# Patient Record
Sex: Female | Born: 1970 | Race: Black or African American | Hispanic: No | Marital: Single | Smoking: Never smoker
Health system: Southern US, Community
[De-identification: ages and names within clinical notes are randomized; demographics above are authoritative.]

## PROBLEM LIST (undated history)

## (undated) DIAGNOSIS — J302 Other seasonal allergic rhinitis: Secondary | ICD-10-CM

## (undated) DIAGNOSIS — I471 Supraventricular tachycardia, unspecified: Secondary | ICD-10-CM

## (undated) DIAGNOSIS — I10 Essential (primary) hypertension: Secondary | ICD-10-CM

## (undated) DIAGNOSIS — K760 Fatty (change of) liver, not elsewhere classified: Secondary | ICD-10-CM

## (undated) HISTORY — PX: CARDIAC ELECTROPHYSIOLOGY MAPPING AND ABLATION: SHX1292

## (undated) HISTORY — PX: ABDOMINAL HYSTERECTOMY: SHX81

## (undated) HISTORY — PX: OTHER SURGICAL HISTORY: SHX169

---

## 2012-09-06 ENCOUNTER — Other Ambulatory Visit: Payer: Self-pay | Admitting: Gastroenterology

## 2012-09-06 DIAGNOSIS — R1013 Epigastric pain: Secondary | ICD-10-CM

## 2012-09-06 DIAGNOSIS — R112 Nausea with vomiting, unspecified: Secondary | ICD-10-CM

## 2012-09-21 ENCOUNTER — Encounter (HOSPITAL_COMMUNITY)
Admission: RE | Admit: 2012-09-21 | Discharge: 2012-09-21 | Disposition: A | Discharge status: Home / Self Care | Source: Ambulatory Visit | Attending: Gastroenterology | Admitting: Gastroenterology

## 2012-09-21 ENCOUNTER — Ambulatory Visit (HOSPITAL_COMMUNITY)
Admission: RE | Admit: 2012-09-21 | Discharge: 2012-09-21 | Disposition: A | Discharge status: Home / Self Care | Source: Ambulatory Visit | Attending: Gastroenterology | Admitting: Gastroenterology

## 2012-09-21 DIAGNOSIS — R1013 Epigastric pain: Secondary | ICD-10-CM | POA: Insufficient documentation

## 2012-09-21 DIAGNOSIS — R112 Nausea with vomiting, unspecified: Secondary | ICD-10-CM | POA: Insufficient documentation

## 2012-09-21 DIAGNOSIS — K7689 Other specified diseases of liver: Secondary | ICD-10-CM | POA: Insufficient documentation

## 2012-09-23 ENCOUNTER — Encounter (HOSPITAL_COMMUNITY)
Admission: RE | Admit: 2012-09-23 | Discharge: 2012-09-23 | Disposition: A | Discharge status: Home / Self Care | Source: Ambulatory Visit | Attending: Gastroenterology | Admitting: Gastroenterology

## 2012-09-23 DIAGNOSIS — R1013 Epigastric pain: Secondary | ICD-10-CM | POA: Insufficient documentation

## 2012-09-23 DIAGNOSIS — R1110 Vomiting, unspecified: Secondary | ICD-10-CM | POA: Insufficient documentation

## 2012-09-23 MED ORDER — TECHNETIUM TC 99M MEBROFENIN IV KIT
5.0000 | PACK | Freq: Once | INTRAVENOUS | Status: AC | PRN
  Administered 2012-09-23: 5 via INTRAVENOUS

## 2012-09-23 MED ORDER — SINCALIDE 5 MCG IJ SOLR
INTRAMUSCULAR | Status: AC
  Administered 2012-09-23: 3.3 ug
  Filled 2012-09-23: qty 5

## 2012-10-10 ENCOUNTER — Other Ambulatory Visit: Payer: Self-pay | Admitting: Gastroenterology

## 2012-10-10 DIAGNOSIS — K760 Fatty (change of) liver, not elsewhere classified: Secondary | ICD-10-CM

## 2012-10-17 ENCOUNTER — Encounter (HOSPITAL_COMMUNITY): Payer: Self-pay

## 2012-10-17 ENCOUNTER — Other Ambulatory Visit: Payer: Self-pay | Admitting: Physician Assistant

## 2012-10-20 ENCOUNTER — Other Ambulatory Visit (HOSPITAL_COMMUNITY): Payer: Self-pay | Admitting: Physician Assistant

## 2012-10-20 ENCOUNTER — Ambulatory Visit (HOSPITAL_COMMUNITY)
Admission: RE | Admit: 2012-10-20 | Discharge: 2012-10-20 | Disposition: A | Discharge status: Home / Self Care | Source: Ambulatory Visit | Attending: Physician Assistant | Admitting: Physician Assistant

## 2012-10-20 ENCOUNTER — Encounter (HOSPITAL_COMMUNITY): Payer: Self-pay

## 2012-10-20 ENCOUNTER — Ambulatory Visit (HOSPITAL_COMMUNITY)
Admission: RE | Admit: 2012-10-20 | Discharge: 2012-10-20 | Disposition: A | Discharge status: Home / Self Care | Source: Ambulatory Visit | Attending: Gastroenterology | Admitting: Gastroenterology

## 2012-10-20 DIAGNOSIS — G8918 Other acute postprocedural pain: Secondary | ICD-10-CM

## 2012-10-20 DIAGNOSIS — K7689 Other specified diseases of liver: Secondary | ICD-10-CM | POA: Insufficient documentation

## 2012-10-20 DIAGNOSIS — K760 Fatty (change of) liver, not elsewhere classified: Secondary | ICD-10-CM

## 2012-10-20 HISTORY — DX: Fatty (change of) liver, not elsewhere classified: K76.0

## 2012-10-20 HISTORY — DX: Essential (primary) hypertension: I10

## 2012-10-20 LAB — CBC
Hemoglobin: 11.9 g/dL — ABNORMAL LOW (ref 12.0–15.0)
MCHC: 35.2 g/dL (ref 30.0–36.0)
Platelets: 179 10*3/uL (ref 150–400)
RDW: 11.9 % (ref 11.5–15.5)

## 2012-10-20 LAB — PROTIME-INR
INR: 0.99 (ref 0.00–1.49)
Prothrombin Time: 13 seconds (ref 11.6–15.2)

## 2012-10-20 LAB — APTT: aPTT: 29 seconds (ref 24–37)

## 2012-10-20 MED ORDER — MIDAZOLAM HCL 2 MG/2ML IJ SOLN
INTRAMUSCULAR | Status: AC
  Filled 2012-10-20: qty 4

## 2012-10-20 MED ORDER — FENTANYL CITRATE 0.05 MG/ML IJ SOLN
INTRAMUSCULAR | Status: DC | PRN
  Administered 2012-10-20 (×2): 25 ug via INTRAVENOUS
  Administered 2012-10-20: 50 ug via INTRAVENOUS

## 2012-10-20 MED ORDER — HYDROMORPHONE HCL PF 1 MG/ML IJ SOLN
INTRAMUSCULAR | Status: AC
  Administered 2012-10-20: 12:00:00 via INTRAVENOUS
  Filled 2012-10-20: qty 1

## 2012-10-20 MED ORDER — HYDROCODONE-ACETAMINOPHEN 5-325 MG PO TABS: 1.0000 | ORAL_TABLET | ORAL | Status: DC | PRN

## 2012-10-20 MED ORDER — HYDROMORPHONE HCL PF 1 MG/ML IJ SOLN: 1.0000 mg | Freq: Once | INTRAMUSCULAR | Status: DC

## 2012-10-20 MED ORDER — HYDROCODONE-ACETAMINOPHEN 5-325 MG PO TABS
ORAL_TABLET | ORAL | Status: AC
  Filled 2012-10-20: qty 1

## 2012-10-20 MED ORDER — HYDROCODONE-ACETAMINOPHEN 5-325 MG PO TABS
1.0000 | ORAL_TABLET | Freq: Once | ORAL | Status: AC
  Administered 2012-10-20: 1 via ORAL

## 2012-10-20 MED ORDER — ONDANSETRON HCL 4 MG/2ML IJ SOLN: 4.0000 mg | Freq: Once | INTRAMUSCULAR | Status: DC

## 2012-10-20 MED ORDER — ONDANSETRON HCL 4 MG/2ML IJ SOLN
4.0000 mg | Freq: Once | INTRAMUSCULAR | Status: AC
  Administered 2012-10-20: 4 mg via INTRAVENOUS

## 2012-10-20 MED ORDER — SODIUM CHLORIDE 0.9 % IV SOLN
INTRAVENOUS | Status: DC
  Administered 2012-10-20: 09:00:00 via INTRAVENOUS

## 2012-10-20 MED ORDER — FENTANYL CITRATE 0.05 MG/ML IJ SOLN
INTRAMUSCULAR | Status: AC
  Filled 2012-10-20: qty 4

## 2012-10-20 MED ORDER — ONDANSETRON HCL 4 MG/2ML IJ SOLN
INTRAMUSCULAR | Status: AC
  Filled 2012-10-20: qty 2

## 2012-10-20 MED ORDER — MIDAZOLAM HCL 2 MG/2ML IJ SOLN
INTRAMUSCULAR | Status: DC | PRN
  Administered 2012-10-20 (×2): 1 mg via INTRAVENOUS

## 2012-10-20 NOTE — Progress Notes (Signed)
CLIENT GRIMACES WHEN MOVES IN BED; STATES DOES NOT WANT PAIN MED; PAMELA CAMPBELL,PA NOTIFIED AND IN TO SEE CLIENT

## 2012-10-20 NOTE — Progress Notes (Signed)
STATES I FEEL 100% BETTER NO NAUSEA NO PAIN

## 2012-10-20 NOTE — Progress Notes (Signed)
PAMELA CAMPBELL,PA NOTIFIED OF CONT NAUSEA AND VOMITING NOW YELLOW LIQUID AND ORDER NOTED AND MED GIVEN AND PER PAMELA CAMPBELL,PA WANTS CLIENT TO TAKE PAIN PILL AFTER NAUSEA SUBSIDES

## 2012-10-20 NOTE — Progress Notes (Signed)
PAMELA CAMPBELL NOTIFIED OF CONT C/O PAIN AND ORDER NOTED

## 2012-10-20 NOTE — H&P (Signed)
Ashley George is an 41 y.o. female.   Chief Complaint: Diagnosed with fatty liver -  Non alcoholic 3 yrs ago Most recent liver biopsy 2 yrs ago:  [facility] Liver fxn studies continue to be elevated/stable Scheduled now for liver core biopsy HPI: HTN; elevated LFTs  Past Medical History  Diagnosis Date  . Hypertension   . Fatty liver disease, nonalcoholic     History reviewed. No pertinent past surgical history.  History reviewed. No pertinent family history. Social History:  does not have a smoking history on file. She does not have any smokeless tobacco history on file. Her alcohol and drug histories not on file.  Allergies: No Known Allergies   (Not in a hospital admission)  Results for orders placed during the hospital encounter of 10/20/12 (from the past 48 hour(s))  CBC     Status: Abnormal   Collection Time   10/20/12  8:31 AM      Component Value Range Comment   WBC 6.0  4.0 - 10.5 K/uL    RBC 4.11  3.87 - 5.11 MIL/uL    Hemoglobin 11.9 (*) 12.0 - 15.0 g/dL    HCT 16.1 (*) 09.6 - 46.0 %    MCV 82.2  78.0 - 100.0 fL    MCH 29.0  26.0 - 34.0 pg    MCHC 35.2  30.0 - 36.0 g/dL    RDW 04.5  40.9 - 81.1 %    Platelets 179  150 - 400 K/uL    No results found.  Review of Systems  Constitutional: Negative for fever and weight loss.  Respiratory: Negative for cough.   Cardiovascular: Negative for chest pain.  Gastrointestinal: Negative for nausea and vomiting.  Neurological: Negative for weakness and headaches.    Blood pressure 233/87, pulse 73, temperature 97.9 F (36.6 C), temperature source Oral, resp. rate 18, height 5\' 2"  (1.575 m), weight 144 lb (65.318 kg), SpO2 97.00%. Physical Exam  Constitutional: She is oriented to person, place, and time. She appears well-developed and well-nourished.  Cardiovascular: Normal rate, regular rhythm and normal heart sounds.   No murmur heard. Respiratory: Effort normal and breath sounds normal. She has no wheezes.  GI: Soft.  Bowel sounds are normal. There is no tenderness.  Musculoskeletal: Normal range of motion.  Neurological: She is alert and oriented to person, place, and time.  Psychiatric: She has a normal mood and affect. Her behavior is normal. Judgment and thought content normal.     Assessment/Plan Elevated liver fxn studies x 3 yrs Scheduled for liver core biopsy: evaluate hepatitis status Pt aware of procedure benefits and risks and agreeable to proceed Consent signed and in chart  Reagen Goates A 10/20/2012, 9:23 AM

## 2012-10-20 NOTE — ED Notes (Signed)
C/o pain at incisional site, assist pt to R side, P. [facility] orders rec'd

## 2012-10-20 NOTE — Procedures (Addendum)
Procedure :" random liver core needle biopsy Specimen : 18 g cores x 3 meds : 2  mg versed, 100 mcg fentanyl Blood loss : minimal  Pt tolerated well

## 2012-10-20 NOTE — ED Notes (Signed)
Short stay notified for bed 

## 2012-10-20 NOTE — Progress Notes (Signed)
STATES NAUSEA BETTER AND ATE SANDWICH

## 2012-10-20 NOTE — Progress Notes (Signed)
C/O NAUSEA; NO VOMITING; PAM TURPIN NOTIFIED AND ORDER NOTED

## 2012-10-20 NOTE — H&P (Signed)
Agree 

## 2012-11-03 ENCOUNTER — Encounter (HOSPITAL_COMMUNITY): Payer: Self-pay | Admitting: *Deleted

## 2012-11-03 ENCOUNTER — Emergency Department (INDEPENDENT_AMBULATORY_CARE_PROVIDER_SITE_OTHER)
Admission: EM | Admit: 2012-11-03 | Discharge: 2012-11-03 | Disposition: A | Discharge status: Home / Self Care | Source: Home / Self Care | Attending: Family Medicine | Admitting: Family Medicine

## 2012-11-03 DIAGNOSIS — R1110 Vomiting, unspecified: Secondary | ICD-10-CM

## 2012-11-03 DIAGNOSIS — E876 Hypokalemia: Secondary | ICD-10-CM

## 2012-11-03 LAB — POCT I-STAT, CHEM 8
BUN: 11 mg/dL (ref 6–23)
Calcium, Ion: 1.21 mmol/L (ref 1.12–1.23)
Chloride: 102 mEq/L (ref 96–112)
Creatinine, Ser: 0.6 mg/dL (ref 0.50–1.10)
Glucose, Bld: 141 mg/dL — ABNORMAL HIGH (ref 70–99)
TCO2: 28 mmol/L (ref 0–100)

## 2012-11-03 MED ORDER — ONDANSETRON HCL 4 MG PO TABS: 4.0000 mg | ORAL_TABLET | Freq: Four times a day (QID) | ORAL | Status: DC

## 2012-11-03 MED ORDER — POTASSIUM CHLORIDE ER 10 MEQ PO TBCR: 10.0000 meq | EXTENDED_RELEASE_TABLET | Freq: Two times a day (BID) | ORAL | Status: DC

## 2012-11-03 NOTE — ED Provider Notes (Signed)
History     CSN: [id number]  Arrival date & time 11/03/12  1549   First MD Initiated Contact with Patient 11/03/12 1629      Chief Complaint  Patient presents with  . Dizziness    (Consider location/radiation/quality/duration/timing/severity/associated sxs/prior treatment) Patient is a 41 y.o. female presenting with vomiting. The history is provided by the patient.  Emesis  This is a new problem. The current episode started more than 2 days ago (had liver bx by Dr Loreta Ave on 12/5, current sx started on mon 12/16.). The problem has not changed since onset.The emesis has an appearance of stomach contents and bilious material. There has been no fever. Associated symptoms include abdominal pain and headaches. Pertinent negatives include no chills, no diarrhea and no fever.    Past Medical History  Diagnosis Date  . Hypertension   . Fatty liver disease, nonalcoholic     Past Surgical History  Procedure Date  . Hepatic biopsy     x 2  . Abdominal hysterectomy   . Cardiac electrophysiology mapping and ablation     Family History  Problem Relation Age of Onset  . Heart disease Mother   . Diabetes Mother   . Schizophrenia Mother   . Diabetes Father   . Thyroid disease Father     History  Substance Use Topics  . Smoking status: Never Smoker   . Smokeless tobacco: Not on file  . Alcohol Use: No    OB History    Grav Para Term Preterm Abortions TAB SAB Ect Mult Living                  Review of Systems  Constitutional: Negative for fever and chills.  Cardiovascular: Negative for palpitations.  Gastrointestinal: Positive for nausea, vomiting and abdominal pain. Negative for diarrhea, constipation and blood in stool.  Neurological: Positive for headaches.    Allergies  Review of patient's allergies indicates no known allergies.  Home Medications   Current Outpatient Rx  Name  Route  Sig  Dispense  Refill  . ESOMEPRAZOLE MAGNESIUM 40 MG PO CPDR   Oral   Take 40  mg by mouth 2 (two) times daily.         Marland Kitchen LISINOPRIL 10 MG PO TABS   Oral   Take 10 mg by mouth daily.         Marland Kitchen ONDANSETRON HCL 4 MG PO TABS   Oral   Take 1 tablet (4 mg total) by mouth every 6 (six) hours. As needed for n/v   12 tablet   0   . POTASSIUM CHLORIDE ER 10 MEQ PO TBCR   Oral   Take 1 tablet (10 mEq total) by mouth 2 (two) times daily.   60 tablet   0     BP 129/84  Pulse 76  Temp 97.8 F (36.6 C) (Oral)  Resp 17  SpO2 100%  Physical Exam  Nursing note and vitals reviewed. Constitutional: She is oriented to person, place, and time. She appears well-developed and well-nourished.  Neck: Normal range of motion. Neck supple.  Cardiovascular: Normal rate, normal heart sounds and intact distal pulses.   Pulmonary/Chest: Breath sounds normal.  Abdominal: Soft. Bowel sounds are normal. She exhibits mass. She exhibits no distension. There is hepatomegaly. There is no splenomegaly. There is tenderness in the epigastric area. There is no rebound, no guarding and no CVA tenderness.  Neurological: She is alert and oriented to person, place, and time.  Skin: Skin  is warm and dry.    ED Course  Procedures (including critical care time)  Labs Reviewed  POCT I-STAT, CHEM 8 - Abnormal; Notable for the following:    Potassium 2.9 (*)     Glucose, Bld 141 (*)     All other components within normal limits   No results found.   1. Chronic hypokalemia   2. Vomiting alone       MDM  i-stat--k 2.9, glu 141. Pt with known hypokalemia, does not want to go to ER, will see lmd if problems continue.        Linna Hoff, MD 11/04/12 [phone]

## 2012-11-03 NOTE — ED Notes (Signed)
C/o being lightheaded, vomiting and sweating onset Mon. Vomited 3-4 on Mon., 3 x yesterday and once today. No diarrhea or abdominal pain.

## 2012-11-17 ENCOUNTER — Emergency Department (INDEPENDENT_AMBULATORY_CARE_PROVIDER_SITE_OTHER)
Admission: EM | Admit: 2012-11-17 | Discharge: 2012-11-17 | Disposition: A | Discharge status: Home / Self Care | Source: Home / Self Care

## 2012-11-17 ENCOUNTER — Encounter (HOSPITAL_COMMUNITY): Payer: Self-pay

## 2012-11-17 DIAGNOSIS — J069 Acute upper respiratory infection, unspecified: Secondary | ICD-10-CM

## 2012-11-17 DIAGNOSIS — J45909 Unspecified asthma, uncomplicated: Secondary | ICD-10-CM

## 2012-11-17 LAB — POCT RAPID STREP A: Streptococcus, Group A Screen (Direct): NEGATIVE

## 2012-11-17 MED ORDER — ALBUTEROL SULFATE HFA 108 (90 BASE) MCG/ACT IN AERS: 2.0000 | INHALATION_SPRAY | RESPIRATORY_TRACT | Status: DC | PRN

## 2012-11-17 NOTE — ED Provider Notes (Signed)
Medical screening examination/treatment/procedure(s) were performed by non-physician practitioner and as supervising physician I was immediately available for consultation/collaboration.  Leslee Home, M.D.   Reuben Likes, MD 11/17/12 [phone]

## 2012-11-17 NOTE — ED Provider Notes (Signed)
History     CSN: [id number]  Arrival date & time 11/17/12  1008   First MD Initiated Contact with Patient 11/17/12 1058      Chief Complaint  Patient presents with  . Sore Throat    (Consider location/radiation/quality/duration/timing/severity/associated sxs/prior treatment) HPI Comments: 42 year old female presents with cough and sore throat. She states she was seen here the urgent care approximately third week in December of 2013. And diagnosed with bronchitis. She also had a hypokalemia which was treated with potassium. She left directly to go to [place] where she was seen again and had a potassium evaluated and was told her level is normal. Completed a course of amoxicillin for her bronchitis but she is now having persistent cough and sore throat. She is unsure about fever but no elevated temperature today.   Past Medical History  Diagnosis Date  . Hypertension   . Fatty liver disease, nonalcoholic     Past Surgical History  Procedure Date  . Hepatic biopsy     x 2  . Abdominal hysterectomy   . Cardiac electrophysiology mapping and ablation     Family History  Problem Relation Age of Onset  . Heart disease Mother   . Diabetes Mother   . Schizophrenia Mother   . Diabetes Father   . Thyroid disease Father     History  Substance Use Topics  . Smoking status: Never Smoker   . Smokeless tobacco: Not on file  . Alcohol Use: No    OB History    Grav Para Term Preterm Abortions TAB SAB Ect Mult Living                  Review of Systems  Constitutional: Negative for fever, chills, activity change, appetite change and fatigue.  HENT: Positive for sore throat, rhinorrhea and postnasal drip. Negative for facial swelling, neck pain and neck stiffness.   Eyes: Negative.   Respiratory: Positive for cough. Negative for wheezing.   Cardiovascular: Negative.   Gastrointestinal: Negative.   Musculoskeletal: Negative.   Skin: Negative for pallor and rash.    Neurological: Negative.     Allergies  Review of patient's allergies indicates no known allergies.  Home Medications   Current Outpatient Rx  Name  Route  Sig  Dispense  Refill  . ESOMEPRAZOLE MAGNESIUM 40 MG PO CPDR   Oral   Take 40 mg by mouth 2 (two) times daily.         Marland Kitchen LISINOPRIL 10 MG PO TABS   Oral   Take 10 mg by mouth daily.         . ALBUTEROL SULFATE HFA 108 (90 BASE) MCG/ACT IN AERS   Inhalation   Inhale 2 puffs into the lungs every 4 (four) hours as needed for wheezing.   1 Inhaler   0   . ONDANSETRON HCL 4 MG PO TABS   Oral   Take 1 tablet (4 mg total) by mouth every 6 (six) hours. As needed for n/v   12 tablet   0   . POTASSIUM CHLORIDE ER 10 MEQ PO TBCR   Oral   Take 1 tablet (10 mEq total) by mouth 2 (two) times daily.   60 tablet   0     BP 122/86  Pulse 77  Temp 98.1 F (36.7 C) (Oral)  Resp 18  SpO2 96%  Physical Exam  Nursing note and vitals reviewed. Constitutional: She is oriented to person, place, and time. She appears well-developed  and well-nourished. No distress.  HENT:       Bilateral TMs normal Oropharynx with minor erythema, no exudate  Eyes: Conjunctivae normal and EOM are normal.  Neck: Normal range of motion. Neck supple.  Cardiovascular: Normal rate, regular rhythm and normal heart sounds.   Pulmonary/Chest: Effort normal and breath sounds normal. No respiratory distress. She has no rales.       Suppository phase prolonged with slightly diminished breath sounds.  Musculoskeletal: Normal range of motion. She exhibits no edema.  Lymphadenopathy:    She has no cervical adenopathy.  Neurological: She is alert and oriented to person, place, and time.  Skin: Skin is warm and dry. No rash noted.  Psychiatric: She has a normal mood and affect.    ED Course  Procedures (including critical care time)   Labs Reviewed  POCT RAPID STREP A ([facility] ONLY)   No results found.   1. URI (upper respiratory infection)    2. RAD (reactive airway disease)       MDM  She has a URI and viral pharyngitis. I suspect her cough is due to an occult bronchospasm. Albuterol HFA 2 puffs q. 4 hours when necessary cough and wheeze Cepacol lozenges as needed for sore throat Ibuprofen 600 mg every 6-8 hours when necessary sore throat pain For any worsening, new symptoms, problems or development of fever with cough and shortness of breath will need to seek medical attention promptly. Otherwise followup with your PCP. Results for orders placed during the hospital encounter of 11/17/12  POCT RAPID STREP A ([facility] ONLY)      Component Value Range   Streptococcus, Group A Screen (Direct) NEGATIVE  NEGATIVE          Hayden Rasmussen, NP 11/17/12 1140

## 2012-11-17 NOTE — ED Notes (Signed)
ST since yesterday

## 2013-01-09 ENCOUNTER — Emergency Department (HOSPITAL_COMMUNITY)
Admission: EM | Admit: 2013-01-09 | Discharge: 2013-01-09 | Disposition: A | Discharge status: Home / Self Care | Attending: Emergency Medicine | Admitting: Emergency Medicine

## 2013-01-09 ENCOUNTER — Encounter (HOSPITAL_COMMUNITY): Payer: Self-pay | Admitting: Emergency Medicine

## 2013-01-09 DIAGNOSIS — Z79899 Other long term (current) drug therapy: Secondary | ICD-10-CM | POA: Insufficient documentation

## 2013-01-09 DIAGNOSIS — R5381 Other malaise: Secondary | ICD-10-CM | POA: Insufficient documentation

## 2013-01-09 DIAGNOSIS — R079 Chest pain, unspecified: Secondary | ICD-10-CM | POA: Insufficient documentation

## 2013-01-09 DIAGNOSIS — Z8719 Personal history of other diseases of the digestive system: Secondary | ICD-10-CM | POA: Insufficient documentation

## 2013-01-09 DIAGNOSIS — Z9889 Other specified postprocedural states: Secondary | ICD-10-CM | POA: Insufficient documentation

## 2013-01-09 DIAGNOSIS — Z8679 Personal history of other diseases of the circulatory system: Secondary | ICD-10-CM | POA: Insufficient documentation

## 2013-01-09 DIAGNOSIS — I10 Essential (primary) hypertension: Secondary | ICD-10-CM | POA: Insufficient documentation

## 2013-01-09 HISTORY — DX: Supraventricular tachycardia: I47.1

## 2013-01-09 HISTORY — DX: Supraventricular tachycardia, unspecified: I47.10

## 2013-01-09 LAB — BASIC METABOLIC PANEL
BUN: 13 mg/dL (ref 6–23)
CO2: 29 mEq/L (ref 19–32)
Calcium: 10.1 mg/dL (ref 8.4–10.5)
Chloride: 101 mEq/L (ref 96–112)
Creatinine, Ser: 0.44 mg/dL — ABNORMAL LOW (ref 0.50–1.10)
Glucose, Bld: 126 mg/dL — ABNORMAL HIGH (ref 70–99)

## 2013-01-09 LAB — CBC
HCT: 37.9 % (ref 36.0–46.0)
MCH: 29.9 pg (ref 26.0–34.0)
MCHC: 36.4 g/dL — ABNORMAL HIGH (ref 30.0–36.0)
MCV: 82.2 fL (ref 78.0–100.0)
Platelets: 188 10*3/uL (ref 150–400)
RDW: 12.1 % (ref 11.5–15.5)

## 2013-01-09 NOTE — ED Notes (Signed)
Pt reports for the last 2 days has had dull sensation in left chest, radiating to left arm. States pain typically lasts and has been recurring more frequently up to three times a day. States pain has been made her increasingly more weak. Becomes fatigued walking upstairs.

## 2013-01-09 NOTE — ED Provider Notes (Signed)
History     CSN: [id number]  Arrival date & time 01/09/13  6213   First MD Initiated Contact with Patient 01/09/13 1049      Chief Complaint  Patient presents with  . Chest Pain    (Consider location/radiation/quality/duration/timing/severity/associated sxs/prior treatment) HPI Comments: Patient presents with a one month history of weakness and fatigue.  For the past two days she reports feeling short of breath with walking up steps and exertion.  She denies fevers or chills.  No n/v/d.  She reports to me that she had an ablation performed in 04/2012 at an outside facility in [place].  She was doing better until last month.    Patient is a 42 y.o. female presenting with chest pain. The history is provided by the patient.  Chest Pain Pain location:  L chest Pain quality: pressure   Pain radiates to:  L arm Pain radiates to the back: no   Pain severity:  Moderate Onset quality:  Gradual Timing:  Intermittent Progression:  Worsening Chronicity:  New Relieved by:  Nothing Worsened by:  Nothing tried Ineffective treatments:  None tried   Past Medical History  Diagnosis Date  . Hypertension   . Fatty liver disease, nonalcoholic   . SVT (supraventricular tachycardia)     Past Surgical History  Procedure Laterality Date  . Hepatic biopsy      x 2  . Abdominal hysterectomy    . Cardiac electrophysiology mapping and ablation      Family History  Problem Relation Age of Onset  . Heart disease Mother   . Diabetes Mother   . Schizophrenia Mother   . Diabetes Father   . Thyroid disease Father     History  Substance Use Topics  . Smoking status: Never Smoker   . Smokeless tobacco: Not on file  . Alcohol Use: No    OB History   Grav Para Term Preterm Abortions TAB SAB Ect Mult Living                  Review of Systems  Cardiovascular: Positive for chest pain.  All other systems reviewed and are negative.    Allergies  Latex  Home Medications   Current  Outpatient Rx  Name  Route  Sig  Dispense  Refill  . albuterol (PROVENTIL HFA;VENTOLIN HFA) 108 (90 BASE) MCG/ACT inhaler   Inhalation   Inhale 2 puffs into the lungs every 4 (four) hours as needed for wheezing.   1 Inhaler   0   . esomeprazole (NEXIUM) 40 MG capsule   Oral   Take 40 mg by mouth 2 (two) times daily.         Marland Kitchen lisinopril (PRINIVIL,ZESTRIL) 10 MG tablet   Oral   Take 10 mg by mouth daily.         . potassium chloride (K-DUR,KLOR-CON) 10 MEQ tablet   Oral   Take 10 mEq by mouth daily.           BP 117/84  Pulse 79  Temp(Src) 98.5 F (36.9 C) (Oral)  SpO2 96%  Physical Exam  Nursing note and vitals reviewed. Constitutional: She is oriented to person, place, and time. She appears well-developed and well-nourished. No distress.  HENT:  Head: Normocephalic and atraumatic.  Mouth/Throat: Oropharynx is clear and moist.  Neck: Normal range of motion. Neck supple.  Cardiovascular: Normal rate and regular rhythm.   No murmur heard. Pulmonary/Chest: Effort normal and breath sounds normal. No respiratory distress. She has  no wheezes.  Abdominal: Soft. Bowel sounds are normal.  Musculoskeletal: Normal range of motion. She exhibits no edema.  Lymphadenopathy:    She has no cervical adenopathy.  Neurological: She is alert and oriented to person, place, and time.  Skin: Skin is warm and dry. She is not diaphoretic.    ED Course  Procedures (including critical care time)  Labs Reviewed  CBC - Abnormal; Notable for the following:    MCHC 36.4 (*)    All other components within normal limits  BASIC METABOLIC PANEL  POCT I-STAT TROPONIN I   No results found.   No diagnosis found.   Date: 01/09/2013  Rate: 77  Rhythm: normal sinus rhythm  QRS Axis: normal  Intervals: normal  ST/T Wave abnormalities: normal  Conduction Disutrbances:none  Narrative Interpretation:   Old EKG Reviewed: none available    MDM  The patient presents with chest  discomfort for the past several weeks.  The labs are unremarkable but the ekg shows non-specific t-wave flattening.  She is feeling better and the troponin is negative.  I believe she needs follow up with cardiology in the next 2-3 days.  I have spoken with Trish from [facility] who will make arrangements for this as an outpatient.         Geoffery Lyons, MD 01/09/13 1535

## 2013-01-11 ENCOUNTER — Institutional Professional Consult (permissible substitution): Admitting: Cardiovascular Disease

## 2013-03-17 ENCOUNTER — Emergency Department (HOSPITAL_COMMUNITY)

## 2013-03-17 ENCOUNTER — Emergency Department (HOSPITAL_COMMUNITY)
Admission: EM | Admit: 2013-03-17 | Discharge: 2013-03-17 | Disposition: A | Discharge status: Home / Self Care | Attending: Emergency Medicine | Admitting: Emergency Medicine

## 2013-03-17 ENCOUNTER — Encounter (HOSPITAL_COMMUNITY): Payer: Self-pay | Admitting: Emergency Medicine

## 2013-03-17 DIAGNOSIS — Z79899 Other long term (current) drug therapy: Secondary | ICD-10-CM | POA: Insufficient documentation

## 2013-03-17 DIAGNOSIS — I10 Essential (primary) hypertension: Secondary | ICD-10-CM | POA: Insufficient documentation

## 2013-03-17 DIAGNOSIS — Z7982 Long term (current) use of aspirin: Secondary | ICD-10-CM | POA: Insufficient documentation

## 2013-03-17 DIAGNOSIS — Z8679 Personal history of other diseases of the circulatory system: Secondary | ICD-10-CM | POA: Insufficient documentation

## 2013-03-17 DIAGNOSIS — Z90710 Acquired absence of both cervix and uterus: Secondary | ICD-10-CM | POA: Insufficient documentation

## 2013-03-17 DIAGNOSIS — R109 Unspecified abdominal pain: Secondary | ICD-10-CM | POA: Insufficient documentation

## 2013-03-17 DIAGNOSIS — Z8719 Personal history of other diseases of the digestive system: Secondary | ICD-10-CM | POA: Insufficient documentation

## 2013-03-17 LAB — BASIC METABOLIC PANEL
BUN: 10 mg/dL (ref 6–23)
CO2: 28 mEq/L (ref 19–32)
Calcium: 9.8 mg/dL (ref 8.4–10.5)
Chloride: 100 mEq/L (ref 96–112)
Creatinine, Ser: 0.5 mg/dL (ref 0.50–1.10)
GFR calc Af Amer: >90 mL/min (ref >90)
GFR calc non Af Amer: >90 mL/min (ref >90)
Glucose, Bld: 176 mg/dL — ABNORMAL HIGH (ref 70–99)
Potassium: 3.7 mEq/L (ref 3.5–5.1)
Sodium: 139 mEq/L (ref 135–145)

## 2013-03-17 LAB — URINALYSIS, ROUTINE W REFLEX MICROSCOPIC
Bilirubin Urine: NEGATIVE
Glucose, UA: NEGATIVE mg/dL
Hgb urine dipstick: NEGATIVE
Ketones, ur: NEGATIVE mg/dL
Leukocytes, UA: NEGATIVE
Nitrite: NEGATIVE
Protein, ur: NEGATIVE mg/dL
Specific Gravity, Urine: 1.017 (ref 1.005–1.030)
Urobilinogen, UA: 1 mg/dL (ref 0.0–1.0)
pH: 6 (ref 5.0–8.0)

## 2013-03-17 LAB — CBC WITH DIFFERENTIAL/PLATELET
Basophils Absolute: 0 10*3/uL (ref 0.0–0.1)
Basophils Relative: 1 % (ref 0–1)
Eosinophils Absolute: 0.1 10*3/uL (ref 0.0–0.7)
Eosinophils Relative: 1 % (ref 0–5)
HCT: 33.6 % — ABNORMAL LOW (ref 36.0–46.0)
Hemoglobin: 11.9 g/dL — ABNORMAL LOW (ref 12.0–15.0)
Lymphocytes Relative: 42 % (ref 12–46)
Lymphs Abs: 3.3 10*3/uL (ref 0.7–4.0)
MCH: 29 pg (ref 26.0–34.0)
MCHC: 35.4 g/dL (ref 30.0–36.0)
MCV: 81.8 fL (ref 78.0–100.0)
Monocytes Absolute: 0.6 10*3/uL (ref 0.1–1.0)
Monocytes Relative: 8 % (ref 3–12)
Neutro Abs: 3.8 10*3/uL (ref 1.7–7.7)
Neutrophils Relative %: 48 % (ref 43–77)
Platelets: 173 10*3/uL (ref 150–400)
RBC: 4.11 MIL/uL (ref 3.87–5.11)
RDW: 11.9 % (ref 11.5–15.5)
WBC: 7.8 10*3/uL (ref 4.0–10.5)

## 2013-03-17 MED ORDER — TRAMADOL HCL 50 MG PO TABS: 50.0000 mg | ORAL_TABLET | Freq: Four times a day (QID) | ORAL | Status: DC | PRN

## 2013-03-17 MED ORDER — OXYCODONE HCL 5 MG PO TABS
10.0000 mg | ORAL_TABLET | Freq: Once | ORAL | Status: DC
  Filled 2013-03-17: qty 2

## 2013-03-17 MED ORDER — IBUPROFEN 400 MG PO TABS
600.0000 mg | ORAL_TABLET | Freq: Once | ORAL | Status: AC
  Administered 2013-03-17: 600 mg via ORAL
  Filled 2013-03-17: qty 1

## 2013-03-17 NOTE — ED Notes (Signed)
PT. REPORTS RIGHT FLANK PAIN ONSET YESTERDAY WORSE THIS EVENING , DENIES INJURY , NO HEMATURIA OR DYSURIA.

## 2013-03-17 NOTE — ED Notes (Signed)
Pt refused oxycodone, pt states "thats too strong I don't want that". MD will be made aware and continue to monitor pt.

## 2013-03-22 NOTE — ED Provider Notes (Signed)
History    42 year old female with right flank pain. Gradual onset yesterday. Progressively worsened throughout the night. Denies any trauma. No appreciable exacerbating or relieving factors.  No urinary complaints. He appears her chills. No nausea vomiting. Diarrhea. No history similar symptoms. CSN: [id number]  Arrival date & time 03/17/13  0009   First MD Initiated Contact with Patient 03/17/13 0217      Chief Complaint  Patient presents with  . Flank Pain    (Consider location/radiation/quality/duration/timing/severity/associated sxs/prior treatment) HPI  Past Medical History  Diagnosis Date  . Hypertension   . Fatty liver disease, nonalcoholic   . SVT (supraventricular tachycardia)     Past Surgical History  Procedure Laterality Date  . Hepatic biopsy      x 2  . Abdominal hysterectomy    . Cardiac electrophysiology mapping and ablation      Family History  Problem Relation Age of Onset  . Heart disease Mother   . Diabetes Mother   . Schizophrenia Mother   . Diabetes Father   . Thyroid disease Father     History  Substance Use Topics  . Smoking status: Never Smoker   . Smokeless tobacco: Not on file  . Alcohol Use: No    OB History   Grav Para Term Preterm Abortions TAB SAB Ect Mult Living                  Review of Systems  All systems reviewed and negative, other than as noted in HPI.   Allergies  Latex  Home Medications   Current Outpatient Rx  Name  Route  Sig  Dispense  Refill  . aspirin EC 81 MG tablet   Oral   Take 81 mg by mouth daily.         . carvedilol (COREG) 6.25 MG tablet   Oral   Take 6.25 mg by mouth 2 (two) times daily with a meal.         . esomeprazole (NEXIUM) 40 MG capsule   Oral   Take 40 mg by mouth daily before breakfast.          . ezetimibe (ZETIA) 10 MG tablet   Oral   Take 10 mg by mouth daily.         Marland Kitchen lisinopril (PRINIVIL,ZESTRIL) 20 MG tablet   Oral   Take 20 mg by mouth daily.          . pravastatin (PRAVACHOL) 20 MG tablet   Oral   Take 20 mg by mouth daily.         . traMADol (ULTRAM) 50 MG tablet   Oral   Take 1 tablet (50 mg total) by mouth every 6 (six) hours as needed for pain.   15 tablet   0     BP 111/77  Pulse 80  Temp(Src) 97.7 F (36.5 C) (Oral)  Resp 16  SpO2 97%  Physical Exam  Nursing note and vitals reviewed. Constitutional: She appears well-developed and well-nourished. No distress.  HENT:  Head: Normocephalic and atraumatic.  Eyes: Conjunctivae are normal. Right eye exhibits no discharge. Left eye exhibits no discharge.  Neck: Neck supple.  Cardiovascular: Normal rate, regular rhythm and normal heart sounds.  Exam reveals no gallop and no friction rub.   No murmur heard. Pulmonary/Chest: Effort normal and breath sounds normal. No respiratory distress.  Abdominal: Soft. She exhibits no distension. There is no tenderness.  Genitourinary:  r cva tenderness?  Musculoskeletal: She exhibits no edema  and no tenderness.  Neurological: She is alert.  Skin: Skin is warm and dry.  Psychiatric: She has a normal mood and affect. Her behavior is normal. Thought content normal.    ED Course  Procedures (including critical care time)  Labs Reviewed  CBC WITH DIFFERENTIAL - Abnormal; Notable for the following:    Hemoglobin 11.9 (*)    HCT 33.6 (*)    All other components within normal limits  BASIC METABOLIC PANEL - Abnormal; Notable for the following:    Glucose, Bld 176 (*)    All other components within normal limits  URINALYSIS, ROUTINE W REFLEX MICROSCOPIC   No results found.  Ct Abdomen Pelvis Wo Contrast  03/17/2013  *RADIOLOGY REPORT*  Clinical Data: Right flank pain since yesterday.  CT ABDOMEN AND PELVIS WITHOUT CONTRAST  Technique:  Multidetector CT imaging of the abdomen and pelvis was performed following the standard protocol without intravenous contrast.  Comparison: 10/20/2012  Findings: Minimal right pleural effusion and  basilar atelectasis.  The kidneys appear symmetrical in size and shape.  No renal or ureteral stone.  No pyelocaliectasis or ureterectasis.  No bladder stone or bladder wall thickening.  Diffuse fatty infiltration of the liver.  The gallbladder is contracted, likely physiologic.  The unenhanced appearance of the pancreas, spleen, adrenal glands, abdominal aorta, inferior vena cava, retroperitoneal lymph nodes, and of the decompressed stomach, small bowel, and colon appear unremarkable.  No free air or free fluid in the abdomen.  Abdominal wall musculature appears intact.  Pelvis:  Uterus appears surgically absent.  No free or loculated pelvic fluid collections.  The appendix is normal.  No diverticulitis.  No significant pelvic lymphadenopathy.  Mild degenerative changes in the lumbar spine.  IMPRESSION: No renal or ureteral stone or obstruction.  Fatty infiltration of the liver.  No focal acute process demonstrated in the abdomen or pelvis.   Original Report Authenticated By: Burman Nieves, M.D.     1. Right flank pain       MDM  42 year old female with right flank pain. Atraumatic. Workup fairly unremarkable. Patient reports that his symptoms have improved. Possibly muscular strain. Plan symptomatic treatment at this time. Return precautions discussed.       Raeford Razor, MD 03/22/13 [phone]

## 2013-03-25 ENCOUNTER — Encounter: Payer: Self-pay | Admitting: Obstetrics & Gynecology

## 2013-06-09 ENCOUNTER — Emergency Department (HOSPITAL_COMMUNITY)

## 2013-06-09 ENCOUNTER — Emergency Department (HOSPITAL_COMMUNITY)
Admission: EM | Admit: 2013-06-09 | Discharge: 2013-06-09 | Disposition: A | Discharge status: Home / Self Care | Attending: Emergency Medicine | Admitting: Emergency Medicine

## 2013-06-09 ENCOUNTER — Encounter (HOSPITAL_COMMUNITY): Payer: Self-pay | Admitting: Emergency Medicine

## 2013-06-09 DIAGNOSIS — Z9889 Other specified postprocedural states: Secondary | ICD-10-CM | POA: Insufficient documentation

## 2013-06-09 DIAGNOSIS — Y9289 Other specified places as the place of occurrence of the external cause: Secondary | ICD-10-CM | POA: Insufficient documentation

## 2013-06-09 DIAGNOSIS — Y9389 Activity, other specified: Secondary | ICD-10-CM | POA: Insufficient documentation

## 2013-06-09 DIAGNOSIS — Y990 Civilian activity done for income or pay: Secondary | ICD-10-CM | POA: Insufficient documentation

## 2013-06-09 DIAGNOSIS — W208XXA Other cause of strike by thrown, projected or falling object, initial encounter: Secondary | ICD-10-CM | POA: Insufficient documentation

## 2013-06-09 DIAGNOSIS — I10 Essential (primary) hypertension: Secondary | ICD-10-CM | POA: Insufficient documentation

## 2013-06-09 DIAGNOSIS — R110 Nausea: Secondary | ICD-10-CM | POA: Insufficient documentation

## 2013-06-09 DIAGNOSIS — Z79899 Other long term (current) drug therapy: Secondary | ICD-10-CM | POA: Insufficient documentation

## 2013-06-09 DIAGNOSIS — Z7982 Long term (current) use of aspirin: Secondary | ICD-10-CM | POA: Insufficient documentation

## 2013-06-09 DIAGNOSIS — Z91040 Latex allergy status: Secondary | ICD-10-CM | POA: Insufficient documentation

## 2013-06-09 DIAGNOSIS — S6990XA Unspecified injury of unspecified wrist, hand and finger(s), initial encounter: Secondary | ICD-10-CM | POA: Insufficient documentation

## 2013-06-09 DIAGNOSIS — Z8719 Personal history of other diseases of the digestive system: Secondary | ICD-10-CM | POA: Insufficient documentation

## 2013-06-09 DIAGNOSIS — I498 Other specified cardiac arrhythmias: Secondary | ICD-10-CM | POA: Insufficient documentation

## 2013-06-09 DIAGNOSIS — M79642 Pain in left hand: Secondary | ICD-10-CM

## 2013-06-09 MED ORDER — IBUPROFEN 400 MG PO TABS: 400.0000 mg | ORAL_TABLET | Freq: Four times a day (QID) | ORAL | Status: DC | PRN

## 2013-06-09 MED ORDER — IBUPROFEN 400 MG PO TABS
400.0000 mg | ORAL_TABLET | Freq: Once | ORAL | Status: AC
  Administered 2013-06-09: 400 mg via ORAL
  Filled 2013-06-09: qty 1

## 2013-06-09 NOTE — ED Notes (Signed)
States that she was at work and a heavy box fell on her elft hand and fingers  Middle ring and pinky fingers hurt

## 2013-06-09 NOTE — ED Provider Notes (Signed)
CSN: [id number]     Arrival date & time 06/09/13  1421 History  This chart was scribed for non-physician practitioner working with Suzi Roots, MD, by Ardelia Mems ED Scribe. This patient was seen in room TR07C/TR07C and the patient's care was started at 4:34 PM.   First MD Initiated Contact with Patient 06/09/13 1600     Chief Complaint  Patient presents with  . Hand Injury    The history is provided by the patient. No language interpreter was used.   HPI Comments: Ashley George is a 42 y.o. female with a history of hypertension and SVT who presents to the Emergency Department complaining of constant, moderate "sharp" "7/10" pain in the third, fourth and fifth fingers of her right hand. She reports associated swelling to those fingers. She states that she was at work, moving boxes, and a 60 lb box fell on her left hand. She states that her pain is worsened with movement. She reports associated nausea onset after the injury, due to her pain. She states that she has a history of surgery in right middle finger after a tooth became lodged in the finger from a dog bite. She denies vomiting, fever, chills or any other symptoms.    PCP- Dr. Charna Elizabeth   Past Medical History  Diagnosis Date  . Hypertension   . Fatty liver disease, nonalcoholic   . SVT (supraventricular tachycardia)    Past Surgical History  Procedure Laterality Date  . Hepatic biopsy      x 2  . Abdominal hysterectomy    . Cardiac electrophysiology mapping and ablation     Family History  Problem Relation Age of Onset  . Heart disease Mother   . Diabetes Mother   . Schizophrenia Mother   . Diabetes Father   . Thyroid disease Father    History  Substance Use Topics  . Smoking status: Never Smoker   . Smokeless tobacco: Not on file  . Alcohol Use: No   OB History   Grav Para Term Preterm Abortions TAB SAB Ect Mult Living                 Review of Systems  Constitutional: Negative for fever and  chills.  Gastrointestinal: Positive for nausea. Negative for vomiting.  Musculoskeletal:       Pain and swelling in 3rd, 4th and 5th fingers of left hand.  All other systems reviewed and are negative.    Allergies  Latex  Home Medications   Current Outpatient Rx  Name  Route  Sig  Dispense  Refill  . aspirin EC 81 MG tablet   Oral   Take 81 mg by mouth daily.         . carvedilol (COREG) 6.25 MG tablet   Oral   Take 6.25 mg by mouth 2 (two) times daily with a meal.         . esomeprazole (NEXIUM) 40 MG capsule   Oral   Take 40 mg by mouth daily before breakfast.          . ezetimibe (ZETIA) 10 MG tablet   Oral   Take 10 mg by mouth daily.         Marland Kitchen lisinopril (PRINIVIL,ZESTRIL) 20 MG tablet   Oral   Take 20 mg by mouth daily.          Triage Vitals: BP 125/82  Pulse 85  Temp(Src) 98.5 F (36.9 C) (Oral)  Resp 20  Ht 5'  4" (1.626 m)  Wt 147 lb (66.679 kg)  BMI 25.22 kg/m2  SpO2 97%  Physical Exam  Nursing note and vitals reviewed. Constitutional: She is oriented to person, place, and time. She appears well-developed and well-nourished. No distress.  HENT:  Head: Normocephalic and atraumatic.  Right Ear: External ear normal.  Left Ear: External ear normal.  Nose: Nose normal.  Mouth/Throat: Oropharynx is clear and moist.  Eyes: Conjunctivae are normal.  Neck: Normal range of motion.  Cardiovascular: Normal rate, regular rhythm and normal heart sounds.   Cap refill less than 2 seconds in all fingers.  Pulmonary/Chest: Effort normal and breath sounds normal. No stridor. No respiratory distress. She has no wheezes. She has no rales.  Abdominal: Soft. She exhibits no distension.  Musculoskeletal: Normal range of motion.  Swelling in 3rd, 4th and 5th fingers, with ROM limited due to pain.   Neurological: She is alert and oriented to person, place, and time. She has normal strength.  Neurovascularly intact.  Skin: Skin is warm and dry. She is not  diaphoretic. No erythema.  Psychiatric: She has a normal mood and affect. Her behavior is normal.    ED Course   Procedures (including critical care time)  DIAGNOSTIC STUDIES: Oxygen Saturation is 97% on RA, normal by my interpretation.    COORDINATION OF CARE: 5:01 PM- Pt advised of plan for to receive Ibuprofen in the ED and pt agrees. Pt denies stronger pain medication. Pt also agrees with plan to treat her pain with Ibuprofen, ice and elevation at home and pt agrees. Pt also advised to return for re-evaluation if her pain and swelling worsen or persist.  Medications  ibuprofen (ADVIL,MOTRIN) tablet 400 mg (not administered)     Labs Reviewed - No data to display  Dg Hand Complete Left  06/09/2013   *RADIOLOGY REPORT*  Clinical Data: Pain post trauma  LEFT HAND - COMPLETE 3+ VIEW  Comparison: None.  Findings:  Frontal, oblique, and lateral views were obtained.  No fracture or dislocation.  Joint spaces appear intact.  No erosive change.  IMPRESSION: No abnormality noted.   Original Report Authenticated By: Bretta Bang, M.D.   1. Hand pain, left     MDM  Imaging shows no fracture. Directed pt to ice injury, take acetaminophen or ibuprofen for pain, and to elevate and rest the injury when possible. Patient denied finger splint for support. Denied pain meds in ED. Neurovascularly intact. Compartment soft. ROM is limited due to pain. Return instructions given. Vital signs stable for discharge. Patient / Family / Caregiver informed of clinical course, understand medical decision-making process, and agree with plan.      I personally performed the services described in this documentation, which was scribed in my presence. The recorded information has been reviewed and is accurate.    Mora Bellman, PA-C 06/10/13 [phone]

## 2013-06-13 NOTE — ED Provider Notes (Signed)
Medical screening examination/treatment/procedure(s) were performed by non-physician practitioner and as supervising physician I was immediately available for consultation/collaboration.   Suzi Roots, MD 06/13/13 [phone]

## 2013-06-22 ENCOUNTER — Emergency Department (HOSPITAL_COMMUNITY)
Admission: EM | Admit: 2013-06-22 | Discharge: 2013-06-22 | Disposition: A | Discharge status: Home / Self Care | Attending: Emergency Medicine | Admitting: Emergency Medicine

## 2013-06-22 ENCOUNTER — Encounter (HOSPITAL_COMMUNITY): Payer: Self-pay | Admitting: Emergency Medicine

## 2013-06-22 ENCOUNTER — Emergency Department (HOSPITAL_COMMUNITY)

## 2013-06-22 DIAGNOSIS — Z8719 Personal history of other diseases of the digestive system: Secondary | ICD-10-CM | POA: Insufficient documentation

## 2013-06-22 DIAGNOSIS — I10 Essential (primary) hypertension: Secondary | ICD-10-CM | POA: Insufficient documentation

## 2013-06-22 DIAGNOSIS — I498 Other specified cardiac arrhythmias: Secondary | ICD-10-CM | POA: Insufficient documentation

## 2013-06-22 DIAGNOSIS — Z91040 Latex allergy status: Secondary | ICD-10-CM | POA: Insufficient documentation

## 2013-06-22 DIAGNOSIS — R079 Chest pain, unspecified: Secondary | ICD-10-CM | POA: Insufficient documentation

## 2013-06-22 DIAGNOSIS — Z7982 Long term (current) use of aspirin: Secondary | ICD-10-CM | POA: Insufficient documentation

## 2013-06-22 DIAGNOSIS — Z79899 Other long term (current) drug therapy: Secondary | ICD-10-CM | POA: Insufficient documentation

## 2013-06-22 LAB — BASIC METABOLIC PANEL
CO2: 26 mEq/L (ref 19–32)
Chloride: 101 mEq/L (ref 96–112)
Creatinine, Ser: 0.51 mg/dL (ref 0.50–1.10)
Glucose, Bld: 105 mg/dL — ABNORMAL HIGH (ref 70–99)
Sodium: 139 mEq/L (ref 135–145)

## 2013-06-22 LAB — CBC
Hemoglobin: 12.2 g/dL (ref 12.0–15.0)
MCH: 29.3 pg (ref 26.0–34.0)
MCV: 82.5 fL (ref 78.0–100.0)
Platelets: 195 10*3/uL (ref 150–400)
RBC: 4.16 MIL/uL (ref 3.87–5.11)
WBC: 8.4 10*3/uL (ref 4.0–10.5)

## 2013-06-22 LAB — POCT I-STAT TROPONIN I: Troponin i, poc: 0 ng/mL (ref 0.00–0.08)

## 2013-06-22 NOTE — ED Notes (Signed)
Pt states that she started have abnormal chest pain around midnight tonight.  Pt states that this pain was located under her left breast and radiated up her neck. Pt states that her pain has since decreased to her "normal" level of chest pain. 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

## 2013-06-22 NOTE — ED Notes (Signed)
Bednar, MD at bedside. 

## 2013-06-22 NOTE — ED Provider Notes (Addendum)
CSN: [id number]     Arrival date & time 06/22/13  0138 History     First MD Initiated Contact with Patient 06/22/13 [id number]     Chief Complaint  Patient presents with  . Chest Pain   (Consider location/radiation/quality/duration/timing/severity/associated sxs/prior Treatment) HPI This 42 year old female has history of SVT she has a few episodes per month since ablation of brief palpitations but tonight is here because she had a spell at midnight of a sudden onset sharp stabbing chest pain that lasted less than 5 minutes with no associated symptoms no palpitations no lightheadedness no shortness of breath no fever she has had a cough for a few days nasal congestion for about a week. PERC negative she has been asymptomatic now in the ED. Past Medical History  Diagnosis Date  . Hypertension   . Fatty liver disease, nonalcoholic   . SVT (supraventricular tachycardia)    Past Surgical History  Procedure Laterality Date  . Hepatic biopsy      x 2  . Abdominal hysterectomy    . Cardiac electrophysiology mapping and ablation     Family History  Problem Relation Age of Onset  . Heart disease Mother   . Diabetes Mother   . Schizophrenia Mother   . Diabetes Father   . Thyroid disease Father    History  Substance Use Topics  . Smoking status: Never Smoker   . Smokeless tobacco: Not on file  . Alcohol Use: No   OB History   Grav Para Term Preterm Abortions TAB SAB Ect Mult Living                 Review of Systems 10 Systems reviewed and are negative for acute change except as noted in the HPI. Allergies  Latex  Home Medications   Current Outpatient Rx  Name  Route  Sig  Dispense  Refill  . aspirin EC 81 MG tablet   Oral   Take 81 mg by mouth daily.         . carvedilol (COREG) 6.25 MG tablet   Oral   Take 6.25 mg by mouth 2 (two) times daily with a meal.         . esomeprazole (NEXIUM) 40 MG capsule   Oral   Take 40 mg by mouth daily before breakfast.          .  ezetimibe (ZETIA) 10 MG tablet   Oral   Take 10 mg by mouth daily.         Marland Kitchen lisinopril (PRINIVIL,ZESTRIL) 20 MG tablet   Oral   Take 20 mg by mouth daily.          BP 126/82  Pulse 63  Temp(Src) 97.5 F (36.4 C) (Oral)  Resp 15  SpO2 100% Physical Exam  Nursing note and vitals reviewed. Constitutional:  Awake, alert, nontoxic appearance.  HENT:  Head: Atraumatic.  Eyes: Right eye exhibits no discharge. Left eye exhibits no discharge.  Neck: Neck supple.  Cardiovascular: Normal rate and regular rhythm.   No murmur heard. Pulmonary/Chest: Effort normal and breath sounds normal. No respiratory distress. She has no wheezes. She has no rales. She exhibits no tenderness.  Abdominal: Soft. There is no tenderness. There is no rebound.  Musculoskeletal: She exhibits no edema and no tenderness.  Baseline ROM, no obvious new focal weakness.  Neurological:  Mental status and motor strength appears baseline for patient and situation.  Skin: No rash noted.  Psychiatric: She has a normal mood  and affect.    ED Course  Patient informed of clinical course, understand medical decision-making process, and agree with plan. Procedures (including critical care time) ECG: Normal sinus rhythm, ventricular rate 66, normal axis, normal intervals, nonspecific T wave abnormality, no significant change noted compared with February 2014 Labs Reviewed  CBC - Abnormal; Notable for the following:    HCT 34.3 (*)    All other components within normal limits  BASIC METABOLIC PANEL - Abnormal; Notable for the following:    Potassium 3.3 (*)    Glucose, Bld 105 (*)    All other components within normal limits  POCT I-STAT TROPONIN I   Dg Chest 2 View  06/22/2013   *RADIOLOGY REPORT*  Clinical Data: Chest pain  CHEST - 2 VIEW  Comparison: None.  Findings: Cardiac and mediastinal silhouettes are within normal limits.  Lungs are normally inflated.  No airspace consolidation, pleural effusion, or  pulmonary edema identified.  There is no pneumothorax.  No acute osseous abnormality.  IMPRESSION:  No acute cardiopulmonary process.   Original Report Authenticated By: Rise Mu, M.D.   1. Chest pain     MDM  I doubt any other [facility] precluding discharge at this time including, but not necessarily limited to the following:ACS, PE.  Hurman Horn, MD 06/22/13 2114  Hurman Horn, MD 06/22/13 2126

## 2013-06-22 NOTE — ED Notes (Addendum)
PT. REPORTS PAIN UNDER LEFT BREAST THIS EVENING WITH NAUSEA AND VOMITTING x1 , DENIES SOB OR DIAPHORESIS . HISTORY OF SVT  / ABLATION - HER CARDIOLOGIST IS DR. GANDJI.

## 2013-07-10 ENCOUNTER — Ambulatory Visit: Admitting: Obstetrics & Gynecology

## 2013-09-08 ENCOUNTER — Encounter (HOSPITAL_COMMUNITY): Payer: Self-pay | Admitting: Pharmacy Technician

## 2013-09-12 ENCOUNTER — Ambulatory Visit (HOSPITAL_COMMUNITY)
Admission: RE | Admit: 2013-09-12 | Discharge: 2013-09-12 | Disposition: A | Discharge status: Home / Self Care | Source: Ambulatory Visit | Attending: Cardiology | Admitting: Cardiology

## 2013-09-12 ENCOUNTER — Encounter (HOSPITAL_COMMUNITY)
Admission: RE | Disposition: A | Discharge status: Home / Self Care | Payer: Self-pay | Source: Ambulatory Visit | Attending: Cardiology

## 2013-09-12 DIAGNOSIS — K7689 Other specified diseases of liver: Secondary | ICD-10-CM | POA: Insufficient documentation

## 2013-09-12 DIAGNOSIS — I10 Essential (primary) hypertension: Secondary | ICD-10-CM | POA: Insufficient documentation

## 2013-09-12 DIAGNOSIS — E785 Hyperlipidemia, unspecified: Secondary | ICD-10-CM | POA: Insufficient documentation

## 2013-09-12 DIAGNOSIS — K219 Gastro-esophageal reflux disease without esophagitis: Secondary | ICD-10-CM | POA: Insufficient documentation

## 2013-09-12 DIAGNOSIS — R079 Chest pain, unspecified: Secondary | ICD-10-CM | POA: Insufficient documentation

## 2013-09-12 DIAGNOSIS — Z79899 Other long term (current) drug therapy: Secondary | ICD-10-CM | POA: Insufficient documentation

## 2013-09-12 HISTORY — PX: LEFT HEART CATHETERIZATION WITH CORONARY ANGIOGRAM: SHX5451

## 2013-09-12 SURGERY — LEFT HEART CATHETERIZATION WITH CORONARY ANGIOGRAM
Anesthesia: LOCAL

## 2013-09-12 MED ORDER — SODIUM CHLORIDE 0.9 % IV BOLUS (SEPSIS)
500.0000 mL | Freq: Once | INTRAVENOUS | Status: AC
  Administered 2013-09-12: 1000 mL via INTRAVENOUS

## 2013-09-12 MED ORDER — SODIUM CHLORIDE 0.9 % IV SOLN: 1.0000 mL/kg/h | INTRAVENOUS | Status: DC

## 2013-09-12 MED ORDER — ASPIRIN 81 MG PO CHEW
CHEWABLE_TABLET | ORAL | Status: AC
  Filled 2013-09-12: qty 1

## 2013-09-12 MED ORDER — HYDROMORPHONE HCL PF 2 MG/ML IJ SOLN
INTRAMUSCULAR | Status: AC
  Filled 2013-09-12: qty 1

## 2013-09-12 MED ORDER — SODIUM CHLORIDE 0.9 % IJ SOLN: 3.0000 mL | Freq: Two times a day (BID) | INTRAMUSCULAR | Status: DC

## 2013-09-12 MED ORDER — POTASSIUM CHLORIDE CRYS ER 20 MEQ PO TBCR
20.0000 meq | EXTENDED_RELEASE_TABLET | Freq: Once | ORAL | Status: AC
  Administered 2013-09-12: 20 meq via ORAL

## 2013-09-12 MED ORDER — ASPIRIN 81 MG PO CHEW
81.0000 mg | CHEWABLE_TABLET | ORAL | Status: AC
  Administered 2013-09-12: 81 mg via ORAL

## 2013-09-12 MED ORDER — ONDANSETRON HCL 4 MG/2ML IJ SOLN: 4.0000 mg | Freq: Four times a day (QID) | INTRAMUSCULAR | Status: DC | PRN

## 2013-09-12 MED ORDER — FENTANYL CITRATE 0.05 MG/ML IJ SOLN
INTRAMUSCULAR | Status: AC
  Filled 2013-09-12: qty 2

## 2013-09-12 MED ORDER — SODIUM CHLORIDE 0.9 % IJ SOLN: 3.0000 mL | INTRAMUSCULAR | Status: DC | PRN

## 2013-09-12 MED ORDER — SODIUM CHLORIDE 0.9 % IV SOLN: 250.0000 mL | INTRAVENOUS | Status: DC | PRN

## 2013-09-12 MED ORDER — MIDAZOLAM HCL 2 MG/2ML IJ SOLN
INTRAMUSCULAR | Status: AC
  Filled 2013-09-12: qty 2

## 2013-09-12 MED ORDER — VERAPAMIL HCL 2.5 MG/ML IV SOLN
INTRAVENOUS | Status: AC
  Filled 2013-09-12: qty 2

## 2013-09-12 MED ORDER — POTASSIUM CHLORIDE CRYS ER 20 MEQ PO TBCR
EXTENDED_RELEASE_TABLET | ORAL | Status: AC
  Filled 2013-09-12: qty 1

## 2013-09-12 MED ORDER — HEPARIN SODIUM (PORCINE) 1000 UNIT/ML IJ SOLN
INTRAMUSCULAR | Status: AC
  Filled 2013-09-12: qty 1

## 2013-09-12 MED ORDER — SODIUM CHLORIDE 0.9 % IV SOLN: INTRAVENOUS | Status: DC

## 2013-09-12 MED ORDER — ACETAMINOPHEN 325 MG PO TABS: 650.0000 mg | ORAL_TABLET | ORAL | Status: DC | PRN

## 2013-09-12 NOTE — H&P (Signed)
  Please see office visit notes for complete details of HPI.  

## 2013-09-12 NOTE — CV Procedure (Signed)
Procedure performed:  Left heart catheterization including hemodynamic monitoring of the left ventricle, LV gram, selective right and left coronary arteriography.  Indication patient is a 42 year-old AA Female with history of hypertension,  hyperlipidemia, who presents with chest pain. Patient has  had non invasive testing which was low risk.  She then had recurrent admission to the hospital/outpatient evaluation for chest pain, hence is brought to the cardiac catheterization lab to evaluate the  coronary anatomy for definitive diagnosis of CAD.  Hemodynamic data:  Left ventricular pressure was 111/2 with LVEDP of 5 mm mercury. Aortic pressure was 111/72 with a mean of 90 mm mercury. There was no pressure gradient across the aortic valve  Left ventricle: Performed in the RAO projection revealed LVEF of 60%. There was No MR. No wall motion abnormality.  Right coronary artery: The vessel is smooth, normal,  Dominant.  Left main coronary artery is large and normal.  Circumflex coronary artery: A large vessel giving origin to  Two small OM1 and 2 and continues as large OM 3 after AV grove branch. It is smooth and normal.   LAD:  LAD gives origin to 2 small to moderate diagonal-1 and 2. Normal.  Abdominal aortogram: Normal renal arteries, moderate abdominal aneurysm. Renal arteries are widely patent. Performed a part evaluate for hypertension with hypertensive heart disease and evaluate for renovascular hypertension and fibromuscular dysplasia.  Impression: Normal coronary arteries, right dominant septation with normal left systolic function.  Technique: Under sterile precautions using a 6 French right radial  arterial access, a 6 French sheath was introduced into the right radial artery. A 5 [place] Tig 4 catheter was advanced into the ascending aorta selective  right coronary artery and left coronary artery was cannulated and angiography was performed in multiple views. The same catheter was then  advanced into the descending aorta and abdominal aortogram with radiation of the renal arteries was performed. The catheter was pulled back Out of the body over exchange length J-wire.  5 F pig tail Catheter was used to perform LV gram which was performed in RAO projection. Catheter exchanged out of the body over J-Wire. NO immediate complications noted. Patient tolerated the procedure well.   Rec: Medical therapy with aggressive risk factor reduction. Evaluation for noncardiac causes of chest pain is indicated. Patient may have syndrome X. a total of 50 cc of contrast was utilized for diagnostic angiography  Disposition: Will be discharged home today with outpatient follow up.

## 2013-09-12 NOTE — Interval H&P Note (Signed)
History and Physical Interval Note:  09/12/2013 7:50 AM  Julieanne Cotton  has presented today for surgery, with the diagnosis of angina  The various methods of treatment have been discussed with the patient and family. After consideration of risks, benefits and other options for treatment, the patient has consented to  Procedure(s): LEFT HEART CATHETERIZATION WITH CORONARY ANGIOGRAM (N/A) possible PCI as a surgical intervention .  The patient's history has been reviewed, patient examined, no change in status, stable for surgery.  I have reviewed the patient's chart and labs.  Questions were answered to the patient's satisfaction.   Cath Lab Visit (complete for each Cath Lab visit)  Clinical Evaluation Leading to the Procedure:   ACS: no  Non-ACS:    Anginal Classification: CCS III  Anti-ischemic medical therapy: Maximal Therapy (2 or more classes of medications)  Non-Invasive Test Results: Low-risk stress test findings: cardiac mortality <1%/year  Prior CABG: No previous CABG        [facility]

## 2013-10-23 ENCOUNTER — Ambulatory Visit (INDEPENDENT_AMBULATORY_CARE_PROVIDER_SITE_OTHER): Admitting: Obstetrics & Gynecology

## 2013-10-23 ENCOUNTER — Other Ambulatory Visit: Payer: Self-pay | Admitting: Obstetrics & Gynecology

## 2013-10-23 ENCOUNTER — Encounter: Payer: Self-pay | Admitting: Obstetrics & Gynecology

## 2013-10-23 VITALS — BP 144/93 | HR 69 | Temp 97.9°F | Ht 63.0 in | Wt 148.0 lb

## 2013-10-23 DIAGNOSIS — Z1231 Encounter for screening mammogram for malignant neoplasm of breast: Secondary | ICD-10-CM

## 2013-10-23 DIAGNOSIS — Z01419 Encounter for gynecological examination (general) (routine) without abnormal findings: Secondary | ICD-10-CM

## 2013-10-23 DIAGNOSIS — N951 Menopausal and female climacteric states: Secondary | ICD-10-CM

## 2013-10-23 DIAGNOSIS — Z0000 Encounter for general adult medical examination without abnormal findings: Secondary | ICD-10-CM

## 2013-10-23 LAB — POCT URINALYSIS DIPSTICK
Bilirubin, UA: NEGATIVE
Glucose, UA: NEGATIVE
Ketones, UA: NEGATIVE
Leukocytes, UA: NEGATIVE
Nitrite, UA: NEGATIVE
Spec Grav, UA: 1.015
pH, UA: 6

## 2013-10-23 MED ORDER — PAROXETINE MESYLATE 7.5 MG PO CAPS: 7.5000 mg | ORAL_CAPSULE | Freq: Every morning | ORAL | Status: DC

## 2013-10-23 NOTE — Patient Instructions (Signed)
Bone Health Our bones do many things. They provide structure, protect organs, anchor muscles, and store calcium. Adequate calcium in your diet and weight-bearing physical activity help build strong bones, improve bone amounts, and may reduce the risk of weakening of bones (osteoporosis) later in life. PEAK BONE MASS By age 42, the average woman has acquired most of her skeletal bone mass. A large decline occurs in older adults which increases the risk of osteoporosis. In women this occurs around the time of menopause. It is important for young girls to reach their peak bone mass in order to maintain bone health throughout life. A person with high bone mass as a young adult will be more likely to have a higher bone mass later in life. Not enough calcium consumption and physical activity early on could result in a failure to achieve optimum bone mass in adulthood. OSTEOPOROSIS Osteoporosis is a disease of the bones. It is defined as low bone mass with deterioration of bone structure. Osteoporosis leads to an increase risk of fractures with falls. These fractures commonly happen in the wrist, hip, and spine. While men and women of all ages and background can develop osteoporosis, some of the risk factors for osteoporosis are:  Female.  White.  Postmenopausal.  Older adults.  Small in body size.  Eating a diet low in calcium.  Physically inactive.  Smoking.  Use of some medications.  Family history. CALCIUM Calcium is a mineral needed by the body for healthy bones, teeth, and proper function of the heart, muscles, and nerves. The body cannot produce calcium so it must be absorbed through food. Good sources of calcium include:  Dairy products (low fat or nonfat milk, cheese, and yogurt).  Dark green leafy vegetables (bok choy and broccoli).  Calcium fortified foods (orange juice, cereal, bread, soy beverages, and tofu products).  Nuts (almonds). Recommended amounts of calcium vary  for individuals. RECOMMENDED CALCIUM INTAKES Age and Amount in mg per day  Children 1 to 3 years / 700 mg  Children 4 to 8 years / 1,000 mg  Children 9 to 13 years / 1,300 mg  Teens 14 to 18 years / 1,300 mg  Adults 19 to 50 years / 1,000 mg  Adult women 51 to 70 years / 1,200 mg  Adults 71 years and older / 1,200 mg  Pregnant and breastfeeding teens / 1,300 mg  Pregnant and breastfeeding adults / 1,000 mg Vitamin D also plays an important role in healthy bone development. Vitamin D helps in the absorption of calcium. WEIGHT-BEARING PHYSICAL ACTIVITY Regular physical activity has many positive health benefits. Benefits include strong bones. Weight-bearing physical activity early in life is important in reaching peak bone mass. Weight-bearing physical activities cause muscles and bones to work against gravity. Some examples of weight bearing physical activities include:  Walking, jogging, or running.  Field Hockey.  Jumping rope.  Dancing.  Soccer.  Tennis or Racquetball.  Stair climbing.  Basketball.  Hiking.  Weight lifting.  Aerobic fitness classes. Including weight-bearing physical activity into an exercise plan is a great way to keep bones healthy. Adults: Engage in at least 30 minutes of moderate physical activity on most, preferably all, days of the week. Children: Engage in at least 60 minutes of moderate physical activity on most, preferably all, days of the week. FOR MORE INFORMATION [place] Department of Agriculture, Center for Nutrition Policy and Promotion: www.cnpp.usda.gov National Osteoporosis Foundation: www.nof.org Document Released: 01/23/2004 Document Revised: 02/27/2013 Document Reviewed: 04/24/2009 [employer] Patient Information   2014 [place], [place]. Menopause Menopause is the normal time of life when menstrual periods stop completely. Menopause is complete when you have missed 12 consecutive menstrual periods. It usually occurs between  the ages of 48 years and 55 years. Very rarely does a woman develop menopause before the age of 40 years. At menopause, your ovaries stop producing the female hormones estrogen and progesterone. This can cause undesirable symptoms and also affect your health. Sometimes the symptoms may occur 4 5 years before the menopause begins. There is no relationship between menopause and:  Oral contraceptives.  Number of children you had.  Race.  The age your menstrual periods started (menarche). Heavy smokers and very thin women may develop menopause earlier in life. CAUSES  The ovaries stop producing the female hormones estrogen and progesterone.  Other causes include:  Surgery to remove both ovaries.  The ovaries stop functioning for no known reason.  Tumors of the pituitary gland in the brain.  Medical disease that affects the ovaries and hormone production.  Radiation treatment to the abdomen or pelvis.  Chemotherapy that affects the ovaries. SYMPTOMS   Hot flashes.  Night sweats.  Decrease in sex drive.  Vaginal dryness and thinning of the vagina causing painful intercourse.  Dryness of the skin and developing wrinkles.  Headaches.  Tiredness.  Irritability.  Memory problems.  Weight gain.  Bladder infections.  Hair growth of the face and chest.  Infertility. More serious symptoms include:  Loss of bone (osteoporosis) causing breaks (fractures).  Depression.  Hardening and narrowing of the arteries (atherosclerosis) causing heart attacks and strokes. DIAGNOSIS   When the menstrual periods have stopped for 12 straight months.  Physical exam.  Hormone studies of the blood. TREATMENT  There are many treatment choices and nearly as many questions about them. The decisions to treat or not to treat menopausal changes is an individual choice made with your health care provider. Your health care provider can discuss the treatments with you. Together, you can  decide which treatment will work best for you. Your treatment choices may include:   Hormone therapy (estrogen and progesterone).  Non-hormonal medicines.  Treating the individual symptoms with medicine (for example antidepressants for depression).  Herbal medicines that may help specific symptoms.  Counseling by a psychiatrist or psychologist.  Group therapy.  Lifestyle changes including:  Eating healthy.  Regular exercise.  Limiting caffeine and alcohol.  Stress management and meditation.  No treatment. HOME CARE INSTRUCTIONS   Take the medicine your health care provider gives you as directed.  Get plenty of sleep and rest.  Exercise regularly.  Eat a diet that contains calcium (good for the bones) and soy products (acts like estrogen hormone).  Avoid alcoholic beverages.  Do not smoke.  If you have hot flashes, dress in layers.  Take supplements, calcium, and vitamin D to strengthen bones.  You can use over-the-counter lubricants or moisturizers for vaginal dryness.  Group therapy is sometimes very helpful.  Acupuncture may be helpful in some cases. SEEK MEDICAL CARE IF:   You are not sure you are in menopause.  You are having menopausal symptoms and need advice and treatment.  You are still having menstrual periods after age 75 years.  You have pain with intercourse.  Menopause is complete (no menstrual period for 12 months) and you develop vaginal bleeding.  You need a referral to a specialist (gynecologist, psychiatrist, or psychologist) for treatment. SEEK IMMEDIATE MEDICAL CARE IF:   You have severe depression.  You  have excessive vaginal bleeding.  You fell and think you have a broken bone.  You have pain when you urinate.  You develop leg or chest pain.  You have a fast pounding heart beat (palpitations).  You have severe headaches.  You develop vision problems.  You feel a lump in your breast.  You have abdominal pain or  severe indigestion. Document Released: 01/23/2004 Document Revised: 07/05/2013 Document Reviewed: 06/01/2013 [facility] Patient Information 2014 [place], [place].

## 2013-10-23 NOTE — Progress Notes (Signed)
Pt in office for annual exam, would like to have STD testing.

## 2013-10-23 NOTE — Progress Notes (Signed)
Subjective:     Ashley George is a 42 y.o. female here for a routine exam.  Current complaints: hot flushes, dry skin.  Personal health questionnaire reviewed: no.   Gynecologic History No LMP recorded. Patient has had a hysterectomy. Contraception: status post hysterectomy Last Pap: 2013. Results were: normal Last mammogram: 2012. Results were: normal  Obstetric History OB History  No data available     The following portions of the patient's history were reviewed and updated as appropriate: allergies, current medications, past family history, past medical history, past social history, past surgical history and problem list.  Review of Systems Pertinent items are noted in HPI.    Objective:      General appearance: alert Breasts: normal appearance, no masses or tenderness Abdomen: soft, non-tender; bowel sounds normal; no masses,  no organomegaly Pelvic:  external genitalia normal, no adnexal masses or tenderness,  vagina normal without discharge       Assessment:    Healthy female exam.  Surgical menopause   Plan:    Calcium/vit D [place] for hot flushes Return prn

## 2013-10-24 LAB — HIV ANTIBODY (ROUTINE TESTING W REFLEX): HIV: NONREACTIVE

## 2013-10-24 LAB — RPR

## 2013-12-21 ENCOUNTER — Emergency Department (HOSPITAL_COMMUNITY)
Admission: EM | Admit: 2013-12-21 | Discharge: 2013-12-21 | Disposition: A | Discharge status: Home / Self Care | Attending: Emergency Medicine | Admitting: Emergency Medicine

## 2013-12-21 ENCOUNTER — Encounter (HOSPITAL_COMMUNITY): Payer: Self-pay | Admitting: Emergency Medicine

## 2013-12-21 DIAGNOSIS — Z79899 Other long term (current) drug therapy: Secondary | ICD-10-CM | POA: Insufficient documentation

## 2013-12-21 DIAGNOSIS — R51 Headache: Secondary | ICD-10-CM | POA: Insufficient documentation

## 2013-12-21 DIAGNOSIS — IMO0002 Reserved for concepts with insufficient information to code with codable children: Secondary | ICD-10-CM | POA: Insufficient documentation

## 2013-12-21 DIAGNOSIS — R519 Headache, unspecified: Secondary | ICD-10-CM

## 2013-12-21 DIAGNOSIS — Z91040 Latex allergy status: Secondary | ICD-10-CM | POA: Insufficient documentation

## 2013-12-21 DIAGNOSIS — I10 Essential (primary) hypertension: Secondary | ICD-10-CM | POA: Insufficient documentation

## 2013-12-21 DIAGNOSIS — Z8719 Personal history of other diseases of the digestive system: Secondary | ICD-10-CM | POA: Insufficient documentation

## 2013-12-21 MED ORDER — ALUM & MAG HYDROXIDE-SIMETH 200-200-20 MG/5ML PO SUSP
30.0000 mL | Freq: Once | ORAL | Status: AC
  Administered 2013-12-21: 30 mL via ORAL
  Filled 2013-12-21: qty 30

## 2013-12-21 MED ORDER — KETOROLAC TROMETHAMINE 60 MG/2ML IM SOLN
60.0000 mg | Freq: Once | INTRAMUSCULAR | Status: AC
  Administered 2013-12-21: 60 mg via INTRAMUSCULAR
  Filled 2013-12-21: qty 2

## 2013-12-21 NOTE — Discharge Instructions (Signed)
Headaches, Frequently Asked Questions °MIGRAINE HEADACHES °Q: What is migraine? What causes it? How can I treat it? °A: Generally, migraine headaches begin as a dull ache. Then they develop into a constant, throbbing, and pulsating pain. You may experience pain at the temples. You may experience pain at the front or back of one or both sides of the head. The pain is usually accompanied by a combination of: °· Nausea. °· Vomiting. °· Sensitivity to light and noise. °Some people (about 15%) experience an aura (see below) before an attack. The cause of migraine is believed to be chemical reactions in the brain. Treatment for migraine may include over-the-counter or prescription medications. It may also include self-help techniques. These include relaxation training and biofeedback.  °Q: What is an aura? °A: About 15% of people with migraine get an "aura". This is a sign of neurological symptoms that occur before a migraine headache. You may see wavy or jagged lines, dots, or flashing lights. You might experience tunnel vision or blind spots in one or both eyes. The aura can include visual or auditory hallucinations (something imagined). It may include disruptions in smell (such as strange odors), taste or touch. Other symptoms include: °· Numbness. °· A "pins and needles" sensation. °· Difficulty in recalling or speaking the correct word. °These neurological events may last as long as 60 minutes. These symptoms will fade as the headache begins. °Q: What is a trigger? °A: Certain physical or environmental factors can lead to or "trigger" a migraine. These include: °· Foods. °· Hormonal changes. °· Weather. °· Stress. °It is important to remember that triggers are different for everyone. To help prevent migraine attacks, you need to figure out which triggers affect you. Keep a headache diary. This is a good way to track triggers. The diary will help you talk to your healthcare professional about your condition. °Q: Does  weather affect migraines? °A: Bright sunshine, hot, humid conditions, and drastic changes in barometric pressure may lead to, or "trigger," a migraine attack in some people. But studies have shown that weather does not act as a trigger for everyone with migraines. °Q: What is the link between migraine and hormones? °A: Hormones start and regulate many of your body's functions. Hormones keep your body in balance within a constantly changing environment. The levels of hormones in your body are unbalanced at times. Examples are during menstruation, pregnancy, or menopause. That can lead to a migraine attack. In fact, about three quarters of all women with migraine report that their attacks are related to the menstrual cycle.  °Q: Is there an increased risk of stroke for migraine sufferers? °A: The likelihood of a migraine attack causing a stroke is very remote. That is not to say that migraine sufferers cannot have a stroke associated with their migraines. In persons under age 40, the most common associated factor for stroke is migraine headache. But over the course of a person's normal life span, the occurrence of migraine headache may actually be associated with a reduced risk of dying from cerebrovascular disease due to stroke.  °Q: What are acute medications for migraine? °A: Acute medications are used to treat the pain of the headache after it has started. Examples over-the-counter medications, NSAIDs, ergots, and triptans.  °Q: What are the triptans? °A: Triptans are the newest class of abortive medications. They are specifically targeted to treat migraine. Triptans are vasoconstrictors. They moderate some chemical reactions in the brain. The triptans work on receptors in your brain. Triptans help   to restore the balance of a neurotransmitter called serotonin. Fluctuations in levels of serotonin are thought to be a main cause of migraine.  °Q: Are over-the-counter medications for migraine effective? °A:  Over-the-counter, or "OTC," medications may be effective in relieving mild to moderate pain and associated symptoms of migraine. But you should see your caregiver before beginning any treatment regimen for migraine.  °Q: What are preventive medications for migraine? °A: Preventive medications for migraine are sometimes referred to as "prophylactic" treatments. They are used to reduce the frequency, severity, and length of migraine attacks. Examples of preventive medications include antiepileptic medications, antidepressants, beta-blockers, calcium channel blockers, and NSAIDs (nonsteroidal anti-inflammatory drugs). °Q: Why are anticonvulsants used to treat migraine? °A: During the past few years, there has been an increased interest in antiepileptic drugs for the prevention of migraine. They are sometimes referred to as "anticonvulsants". Both epilepsy and migraine may be caused by similar reactions in the brain.  °Q: Why are antidepressants used to treat migraine? °A: Antidepressants are typically used to treat people with depression. They may reduce migraine frequency by regulating chemical levels, such as serotonin, in the brain.  °Q: What alternative therapies are used to treat migraine? °A: The term "alternative therapies" is often used to describe treatments considered outside the scope of conventional Western medicine. Examples of alternative therapy include acupuncture, acupressure, and yoga. Another common alternative treatment is herbal therapy. Some herbs are believed to relieve headache pain. Always discuss alternative therapies with your caregiver before proceeding. Some herbal products contain arsenic and other toxins. °TENSION HEADACHES °Q: What is a tension-type headache? What causes it? How can I treat it? °A: Tension-type headaches occur randomly. They are often the result of temporary stress, anxiety, fatigue, or anger. Symptoms include soreness in your temples, a tightening band-like sensation  around your head (a "vice-like" ache). Symptoms can also include a pulling feeling, pressure sensations, and contracting head and neck muscles. The headache begins in your forehead, temples, or the back of your head and neck. Treatment for tension-type headache may include over-the-counter or prescription medications. Treatment may also include self-help techniques such as relaxation training and biofeedback. °CLUSTER HEADACHES °Q: What is a cluster headache? What causes it? How can I treat it? °A: Cluster headache gets its name because the attacks come in groups. The pain arrives with little, if any, warning. It is usually on one side of the head. A tearing or bloodshot eye and a runny nose on the same side of the headache may also accompany the pain. Cluster headaches are believed to be caused by chemical reactions in the brain. They have been described as the most severe and intense of any headache type. Treatment for cluster headache includes prescription medication and oxygen. °SINUS HEADACHES °Q: What is a sinus headache? What causes it? How can I treat it? °A: When a cavity in the bones of the face and skull (a sinus) becomes inflamed, the inflammation will cause localized pain. This condition is usually the result of an allergic reaction, a tumor, or an infection. If your headache is caused by a sinus blockage, such as an infection, you will probably have a fever. An x-ray will confirm a sinus blockage. Your caregiver's treatment might include antibiotics for the infection, as well as antihistamines or decongestants.  °REBOUND HEADACHES °Q: What is a rebound headache? What causes it? How can I treat it? °A: A pattern of taking acute headache medications too often can lead to a condition known as "rebound headache."   A pattern of taking too much headache medication includes taking it more than 2 days per week or in excessive amounts. That means more than the label or a caregiver advises. With rebound  headaches, your medications not only stop relieving pain, they actually begin to cause headaches. Doctors treat rebound headache by tapering the medication that is being overused. Sometimes your caregiver will gradually substitute a different type of treatment or medication. Stopping may be a challenge. Regularly overusing a medication increases the potential for serious side effects. Consult a caregiver if you regularly use headache medications more than 2 days per week or more than the label advises. °ADDITIONAL QUESTIONS AND ANSWERS °Q: What is biofeedback? °A: Biofeedback is a self-help treatment. Biofeedback uses special equipment to monitor your body's involuntary physical responses. Biofeedback monitors: °· Breathing. °· Pulse. °· Heart rate. °· Temperature. °· Muscle tension. °· Brain activity. °Biofeedback helps you refine and perfect your relaxation exercises. You learn to control the physical responses that are related to stress. Once the technique has been mastered, you do not need the equipment any more. °Q: Are headaches hereditary? °A: Four out of five (80%) of people that suffer report a family history of migraine. Scientists are not sure if this is genetic or a family predisposition. Despite the uncertainty, a child has a 50% chance of having migraine if one parent suffers. The child has a 75% chance if both parents suffer.  °Q: Can children get headaches? °A: By the time they reach high school, most young people have experienced some type of headache. Many safe and effective approaches or medications can prevent a headache from occurring or stop it after it has begun.  °Q: What type of doctor should I see to diagnose and treat my headache? °A: Start with your primary caregiver. Discuss his or her experience and approach to headaches. Discuss methods of classification, diagnosis, and treatment. Your caregiver may decide to recommend you to a headache specialist, depending upon your symptoms or other  physical conditions. Having diabetes, allergies, etc., may require a more comprehensive and inclusive approach to your headache. The [employer] will provide, upon request, a list of NHF physician members in your state. °Document Released: 01/23/2004 Document Revised: 01/25/2012 Document Reviewed: 07/02/2008 °Exit[employer]® Patient Information ©2014 [employer]. ° °Migraine Headache °A migraine headache is very bad, throbbing pain on one or both sides of your head. Talk to your doctor about what things may bring on (trigger) your migraine headaches. °HOME CARE °· Only take medicines as told by your doctor. °· Lie down in a dark, quiet room when you have a migraine. °· Keep a journal to find out if certain things bring on migraine headaches. For example, write down: °· What you eat and drink. °· How much sleep you get. °· Any change to your diet or medicines. °· Lessen how much alcohol you drink. °· Quit smoking if you smoke. °· Get enough sleep. °· Lessen any stress in your life. °· Keep lights dim if bright lights bother you or make your migraines worse. °GET HELP RIGHT AWAY IF:  °· Your migraine becomes really bad. °· You have a fever. °· You have a stiff neck. °· You have trouble seeing. °· Your muscles are weak, or you lose muscle control. °· You lose your balance or have trouble walking. °· You feel like you will pass out (faint), or you pass out. °· You have really bad symptoms that are different than your first symptoms. °MAKE SURE   YOU:  °· Understand these instructions. °· Will watch your condition. °· Will get help right away if you are not doing well or get worse. °Document Released: 08/11/2008 Document Revised: 01/25/2012 Document Reviewed: 07/10/2013 °[employer]® Patient Information ©2014 [employer]. ° °

## 2013-12-21 NOTE — ED Notes (Signed)
Patient presents with c/o headache.  Unsure if it is due to her BP or one of her migraines.

## 2013-12-21 NOTE — ED Provider Notes (Signed)
CSN: [id number]     Arrival date & time 12/21/13  0102 History   First MD Initiated Contact with Patient 12/21/13 0249     Chief Complaint  Patient presents with  . Headache   (Consider location/radiation/quality/duration/timing/severity/associated sxs/prior Treatment) HPI 43 yo female presents to emergency room with complaint of headache.  Patient is unsure if this is one of her migraines, or if it is too 2 elevated blood pressure.  Patient reports she was off light.  Earlier today.  She has some pressure in her left ear since landing.  Headache started in the back of her head is no global.  She has mild photo and phonophobia, mild nausea.  She has history of migraines.  Patient has taken 400 mg of Motrin with mild improvement in her symptoms.  She denies missing any doses of her other medications.  No fevers no chills.  No neck stiffness, no focal weakness or numbness. Past Medical History  Diagnosis Date  . Hypertension   . Fatty liver disease, nonalcoholic   . SVT (supraventricular tachycardia)    Past Surgical History  Procedure Laterality Date  . Hepatic biopsy      x 2  . Abdominal hysterectomy    . Cardiac electrophysiology mapping and ablation     Family History  Problem Relation Age of Onset  . Heart disease Mother   . Diabetes Mother   . Schizophrenia Mother   . Diabetes Father   . Thyroid disease Father   . Cancer Maternal Uncle   . Cancer Paternal Grandmother   . Cancer Cousin    History  Substance Use Topics  . Smoking status: Never Smoker   . Smokeless tobacco: Never Used  . Alcohol Use: Yes     Comment: occasional   OB History   Grav Para Term Preterm Abortions TAB SAB Ect Mult Living                 Review of Systems  See History of Present Illness; otherwise all other systems are reviewed and negative Allergies  Latex  Home Medications   Current Outpatient Rx  Name  Route  Sig  Dispense  Refill  . amLODipine (NORVASC) 2.5 MG tablet   Oral  Take 2.5 mg by mouth daily.         . carvedilol (COREG) 6.25 MG tablet   Oral   Take 6.25 mg by mouth daily.          . cyclobenzaprine (FLEXERIL) 5 MG tablet   Oral   Take 5 mg by mouth 3 (three) times daily as needed for muscle spasms.         Marland Kitchen. esomeprazole (NEXIUM) 40 MG capsule   Oral   Take 40 mg by mouth daily before breakfast.          . ezetimibe (ZETIA) 10 MG tablet   Oral   Take 10 mg by mouth daily.         . Flunisolide HFA (AEROSPAN) 80 MCG/ACT AERS   Inhalation   Inhale 2 puffs into the lungs at bedtime.         Marland Kitchen. lisinopril (PRINIVIL,ZESTRIL) 10 MG tablet   Oral   Take 10 mg by mouth daily.         . Pitavastatin Calcium (LIVALO) 2 MG TABS   Oral   Take 2 mg by mouth daily.          BP 120/81  Pulse 79  Temp(Src) 98.1 F (  36.7 C) (Oral)  Resp 14  Ht 5\' 4"  (1.626 m)  Wt 146 lb (66.225 kg)  BMI 25.05 kg/m2  SpO2 100% Physical Exam  Nursing note and vitals reviewed. Constitutional: She is oriented to person, place, and time. She appears well-developed and well-nourished.  HENT:  Head: Normocephalic and atraumatic.  Right Ear: External ear normal.  Left Ear: External ear normal.  Nose: Nose normal.  Mouth/Throat: Oropharynx is clear and moist.  Eyes: Conjunctivae and EOM are normal. Pupils are equal, round, and reactive to light.  Neck: Normal range of motion. Neck supple. No JVD present. No tracheal deviation present. No thyromegaly present.  Cardiovascular: Normal rate, regular rhythm, normal heart sounds and intact distal pulses.  Exam reveals no gallop and no friction rub.   No murmur heard. Pulmonary/Chest: Effort normal and breath sounds normal. No stridor. No respiratory distress. She has no wheezes. She has no rales. She exhibits no tenderness.  Abdominal: Soft. Bowel sounds are normal. She exhibits no distension and no mass. There is no tenderness. There is no rebound and no guarding.  Musculoskeletal: Normal range of  motion. She exhibits no edema and no tenderness.  Lymphadenopathy:    She has no cervical adenopathy.  Neurological: She is alert and oriented to person, place, and time. She has normal reflexes. No cranial nerve deficit. She exhibits normal muscle tone. Coordination normal.  Skin: Skin is warm and dry. No rash noted. No erythema. No pallor.  Psychiatric: She has a normal mood and affect. Her behavior is normal. Judgment and thought content normal.    ED Course  Procedures (including critical care time) Labs Review Labs Reviewed - No data to display Imaging Review No results found.  EKG Interpretation   None       MDM   1. Headache    43 year old female with headache.  Her headache has improved since arrival in the emergency room.  Her blood pressure has improved as well.  Patient is driving.  She is to receive Toradol.  She also complains of burping, and bloating sensation, will give dose of Mylanta.  Plan to refer her to local neurologist for further workup of her headaches.    Olivia Mackie, MD 12/21/13 [phone]

## 2014-01-24 ENCOUNTER — Emergency Department (HOSPITAL_COMMUNITY)
Admission: EM | Admit: 2014-01-24 | Discharge: 2014-01-24 | Disposition: A | Discharge status: Home / Self Care | Attending: Emergency Medicine | Admitting: Emergency Medicine

## 2014-01-24 ENCOUNTER — Encounter (HOSPITAL_COMMUNITY): Payer: Self-pay | Admitting: Emergency Medicine

## 2014-01-24 ENCOUNTER — Emergency Department (HOSPITAL_COMMUNITY)

## 2014-01-24 DIAGNOSIS — H538 Other visual disturbances: Secondary | ICD-10-CM | POA: Insufficient documentation

## 2014-01-24 DIAGNOSIS — I10 Essential (primary) hypertension: Secondary | ICD-10-CM | POA: Insufficient documentation

## 2014-01-24 DIAGNOSIS — J069 Acute upper respiratory infection, unspecified: Secondary | ICD-10-CM

## 2014-01-24 DIAGNOSIS — B9789 Other viral agents as the cause of diseases classified elsewhere: Secondary | ICD-10-CM | POA: Insufficient documentation

## 2014-01-24 DIAGNOSIS — Z8719 Personal history of other diseases of the digestive system: Secondary | ICD-10-CM | POA: Insufficient documentation

## 2014-01-24 DIAGNOSIS — B349 Viral infection, unspecified: Secondary | ICD-10-CM

## 2014-01-24 DIAGNOSIS — Z91040 Latex allergy status: Secondary | ICD-10-CM | POA: Insufficient documentation

## 2014-01-24 MED ORDER — ALBUTEROL SULFATE HFA 108 (90 BASE) MCG/ACT IN AERS
2.0000 | INHALATION_SPRAY | Freq: Once | RESPIRATORY_TRACT | Status: DC
  Filled 2014-01-24: qty 6.7

## 2014-01-24 MED ORDER — AZITHROMYCIN 250 MG PO TABS: 250.0000 mg | ORAL_TABLET | Freq: Every day | ORAL | Status: DC

## 2014-01-24 MED ORDER — BENZONATATE 100 MG PO CAPS
100.0000 mg | ORAL_CAPSULE | Freq: Once | ORAL | Status: AC
  Administered 2014-01-24: 100 mg via ORAL
  Filled 2014-01-24: qty 1

## 2014-01-24 NOTE — Discharge Instructions (Signed)
Please call your doctor for a followup appointment within 24-48 hours. When you talk to your doctor please let them know that you were seen in the emergency department and have them acquire all of your records so that they can discuss the findings with you and formulate a treatment plan to fully care for your new and ongoing problems. Please call and set up appointment with your primary care provider to be reassessed within the next 24-48 hours Please rest and stay hydrated Please drink plenty of water Please take antibiotics as prescribed and on a full stomach  Please continue to use cough syrup as prescribed by your primary care provider Please use albuterol as prescribed Please continue to monitor symptoms and if symptoms are to worsen or change (fever greater than 101, chills, neck pain, chest pain, shortness of breath, difficulty breathing, numbness, tingling, nausea, vomiting, blood in stools, black tarry stools, coughing up blood, sudden loss of vision or visual changes) please report back to the ED immediately  Viral Infections A virus is a type of germ. Viruses can cause:  Minor sore throats.  Aches and pains.  Headaches.  Runny nose.  Rashes.  Watery eyes.  Tiredness.  Coughs.  Loss of appetite.  Feeling sick to your stomach (nausea).  Throwing up (vomiting).  Watery poop (diarrhea). HOME CARE   Only take medicines as told by your doctor.  Drink enough water and fluids to keep your pee (urine) clear or pale yellow. Sports drinks are a good choice.  Get plenty of rest and eat healthy. Soups and broths with crackers or rice are fine. GET HELP RIGHT AWAY IF:   You have a very bad headache.  You have shortness of breath.  You have chest pain or neck pain.  You have an unusual rash.  You cannot stop throwing up.  You have watery poop that does not stop.  You cannot keep fluids down.  You or your child has a temperature by mouth above 102 F (38.9 C),  not controlled by medicine.  Your baby is older than 3 months with a rectal temperature of 102 F (38.9 C) or higher.  Your baby is 373 months old or younger with a rectal temperature of 100.4 F (38 C) or higher. MAKE SURE YOU:   Understand these instructions.  Will watch this condition.  Will get help right away if you are not doing well or get worse. Document Released: 10/15/2008 Document Revised: 01/25/2012 Document Reviewed: 03/10/2011 [employer] Patient Information 2014 [place], [place].   Emergency Department Resource Guide 1) Find a Doctor and Pay Out of Pocket Although you won't have to find out who is covered by your insurance plan, it is a good idea to ask around and get recommendations. You will then need to call the office and see if the doctor you have chosen will accept you as a new patient and what types of options they offer for patients who are self-pay. Some doctors offer discounts or will set up payment plans for their patients who do not have insurance, but you will need to ask so you aren't surprised when you get to your appointment.  2) Contact Your Local Health Department Not all health departments have doctors that can see patients for sick visits, but many do, so it is worth a call to see if yours does. If you don't know where your local health department is, you can check in your phone book. The CDC also has a tool to help  you locate your state's health department, and many state websites also have listings of all of their local health departments.  3) Find a Walk-in Clinic If your illness is not likely to be very severe or complicated, you may want to try a walk in clinic. These are popping up all over the country in pharmacies, drugstores, and shopping centers. They're usually staffed by nurse practitioners or physician assistants that have been trained to treat common illnesses and complaints. They're usually fairly quick and inexpensive. However, if you have  serious medical issues or chronic medical problems, these are probably not your best option.  No Primary Care Doctor: - Call Health Connect at  [phone] - they can help you locate a primary care doctor that  accepts your insurance, provides certain services, etc. - Physician Referral Service- [phone]  Chronic Pain Problems: Organization         Address  Phone   Notes  [facility]  [phone] Patients need to be referred by their primary care doctor.   Medication Assistance: Organization         Address  Phone   Notes  [facility] [address] [place] [place], [place] [address] [phone] --Must be a resident of [facility] -- Must have NO insurance coverage whatsoever (no Medicaid/ Medicare, etc.) -- The pt. MUST have a primary care doctor that directs their care regularly and follows them in the community   [facility]  [phone]   [employer]  [phone]    Agencies that provide inexpensive medical care: Organization         Address  Phone   Notes  [facility]  [phone]   [facility]    [phone]   [facility] [address] [place], [place] [address] [phone]   [facility] [address] [place] [place] [phone]   Planned Parenthood    [phone]   [facility]    [phone]   [facility]  [address], [place] Phone:  [phone], Fax:  [phone] Hours of Operation:  9 am - 6 pm, M-F.  Also accepts Medicaid/Medicare and self-pay.  [facility]  [address], [place] Phone: [phone], Fax: [phone]. Hours of Operation:  8:30 am - 5:30 pm, M-F.  Also accepts Medicaid and self-pay.  [facility] [address] [place] Phone: [phone]   [facility] [address] Natasha Bence [place], [place] [phone], Ext. [phone] Mondays & Thursdays: 7-9 AM.  First 15 patients are seen on a first come, first serve basis.    Medicaid-accepting [facility] Providers:  Organization         Address  Phone   Notes  [facility] [address], [place] [phone] Also accepts self-pay patients.  [facility] [address] Laurell Josephs [place], [place]  [phone]   [facility] [address], [place] [phone]   [facility] [address], [place] [phone]   Renaye Rakers [address], [place]   [phone] Only accepts [place] Access [place] patients after they have their name applied to their card.   Self-Pay (no insurance) in [facility]:  Organization  Address  Phone   Notes  Sickle Cell Patients, [facility] [place] [phone]   [facility] [phone]   [facility] [place]  [address], [place] [phone]   [facility]/Dr. Osei-Bonsu  [address] [place] or [address], [place] [phone] Phone number for both [place] and [place] locations is the same.  [facility] [address], [place] [phone]   [facility] [address], [place] or [address] [phone]   [facility] [address], [place] [phone], phone; [phone], fax Sees patients 1st and 3rd Saturday of every month.  Must not qualify for public or private insurance (i.e. Medicaid, Medicare, [place] Health Choice, Veterans' Benefits)  Household income should be no more than 200% of the poverty level The clinic cannot treat you if you are pregnant or think you are pregnant   Sexually transmitted diseases are not treated at the clinic.    Dental Care: Organization         Address  Phone  Notes  [facility] of [facility] [phone] Accepts children up to age 57 who are enrolled in [place] or [place]; pregnant women with a Medicaid card; and children who have applied for Medicaid or [place] Health Choice, but were declined, whose parents can pay a reduced fee at time of service.  [facility] Department of [facility]  [address], [place] [phone] Accepts children up to age 70 who are enrolled in [place] or [place]; pregnant women with a Medicaid card; and children who have applied for Medicaid or [place] Health Choice, but were declined, whose parents can pay a reduced fee at time of service.  [facility]  [place] [phone] Patients are seen by appointment only. Walk-ins are not accepted. [place] will see patients 34 years of age and older. Monday - Tuesday (8am-5pm) Most Wednesdays (8:30-5pm) $30 per visit, cash only  [facility] Adult Dental Access PROGRAM  [address], [facility] [phone] Patients are seen by appointment only. Walk-ins are not accepted. [place] will see patients 19 years of age and older. One Wednesday Evening (Monthly: Volunteer Based).  $30 per visit, cash only  [place]  [phone] for adults; Children under age 68, call Graduate Pediatric Dentistry at [phone]. Children aged 22-14, please call [phone] to request a pediatric application.  Dental services are provided in all areas of dental care including fillings, crowns and bridges, complete and partial dentures, implants, gum treatment, root canals, and extractions. Preventive care is also provided. Treatment is provided to both adults and children. Patients  are selected via a lottery and there is often a waiting list.   [facility] [address] [place]  [phone] [email or web address]   [facility] [address] [place], [place] [phone], Ext. [phone] Second and Fourth Thursday of each month, opens at 6:30 AM; Clinic ends at 9 AM.  Patients are seen on a first-come first-served basis, and a limited number are seen during each clinic.   [facility]  [address] [place], [place]  [place], [place] [phone]   Eligibility Requirements You must have lived in [place], [place], or [place] counties for at least the last three months.   You cannot be eligible for state or federal sponsored [employer], including [employer], [place], or [employer].   You generally cannot be eligible for healthcare insurance through your employer.    How to apply: Eligibility screenings are held every Tuesday and Wednesday afternoon from 1:00 pm until 4:00 pm. You do not need an appointment for the interview!  [facility] [address] [place], [place]   [place]  [place] Department  [place]  [phone]    Behavioral Health Resources in the Community: Intensive Outpatient Programs Organization         Address  Phone  Notes  [facility]rop Overton. [address], [place], [place] [phone]   [facility] Outpatient [address] [place], [place]   ADS: [facility] [address], [place], [place]   [place] [address],  [place], [place] or [phone]   Substance Abuse Resources Organization         Address  Phone  Notes  [facility] Drug Services  [phone]   [place]  [phone]   [facility] [place]   Chinita Pester  [phone]    Residential & Outpatient Substance Abuse Program  [phone]   Psychological Services Organization         Address  Phone  Notes  [facility]  [place]  [phone]   [place] [address] [place] or [phone]    Mobile Crisis Teams Organization         Address  Phone  Notes  [facility], Mobile Crisis Care Unit  [phone]   [facility]  [address] [place], [place]   [place] [address] [place] [phone]    Self-Help/Support Groups Organization         Address  Phone             Notes  [place]. of [place] - variety of support groups  [place] Call for more information  Narcotics Anonymous (NA), [facility] [address], [employer] [place]  2 meetings at this location   Special educational needs teacher         Address  Phone  Notes  [facility] [place],    [place]  [phone]   [facility]  [address] [place] [address], [place], [place]   Quentin Reynolds, [place] [phone] Admissions: 8am-3pm M-F  Incentives Substance Short [address],    [place], [place] [phone]   [facility] [address] Jadene Pierini [place], [place]   [facility] [address],  [place], [place]   [facility] - Intensive Outpatient [address] Kristeen [address], [place], [place]   [facility] [place].,  [place], [place] or [phone]   [facility] (RTS) [address] [place], [place] Accepts Medicaid  [facility] [place] [address]  [place] [phone] Substance Abuse/Addiction Treatment   [facility]  Resources Organization  Address  Phone  Notes  °[facility]  [phone]   °Julie Brannon, PhD [address] [place], [place]   ([phone] or ([phone]   ° Behavioral   [address] °[place], [employer] [phone]   °[facility] [address], [place], [place] [phone] Insurance/Medicaid/sponsorship through [facility]  °[facility] [address]                                    [place], [place] [phone] Therapy/tele-psych/case  °[facility] [address]  ° [place], [place] [phone]    °Dr. Arfeen  [phone]   °[facility] [place]  [employer] [place] [facility] 1) [address],  °2) [address], [place] °3)  [address], [place] [phone]) [phone] °([phone] ° °([phone]) [phone]   °[place] Child Abuse Hotline (336) 342-1394 or (336) [phone] (After Hours)    ° ° ° ° °

## 2014-01-24 NOTE — ED Notes (Addendum)
Pt c/o dry cough, loose stools, night sweats, decreased appetite. sts she isn't having diarrhea, stools are just a softer than normal. Denies n/v/productive cough/sob/cp. Pt sts she was seen at her PCP but was told she just need to "ride it out". Started taking cough syrup with hydrocodone that helped her sleep. Nad, skin warm and dry, resp e/u.

## 2014-01-24 NOTE — ED Provider Notes (Signed)
CSN: [id number]     Arrival date & time 01/24/14  1329 History  This chart was scribed for non-physician practitioner, Raymon MuttonMarissa Kyrin Garn, PA-C,working with Flint MelterElliott L [place], MD, by Karle PlumberJennifer Tensley, ED Scribe.  This patient was seen in room TR09C/TR09C and the patient's care was started at 4:21 PM.  Chief Complaint  Patient presents with  . Cough   The history is provided by the patient. No language interpreter was used.   HPI Comments:  Ashley George is a 43 y.o. female who presents to the Emergency Department complaining of a severe dry, nonproductive cough, nasal congestion, and sinus pressure that started approximately one week ago. She reports associated subjective fever, chills, blurry vision, chest tightness secondary to cough and HA. She reports sick contacts at work. She reports taking a cough medication with codeine given by her PCP. She denies nausea, vomiting, diarrhea, abdominal pain, neck pain, neck stiffness, loss of vision, hemoptysis, hematochezia, sore throat or difficulty swallowing. She states her PCP is Dr. Allyne GeeSanders. She denies any allergies to any medication, only allergy to latex. Pt denies smoking.  Past Medical History  Diagnosis Date  . Hypertension   . Fatty liver disease, nonalcoholic   . SVT (supraventricular tachycardia)    Past Surgical History  Procedure Laterality Date  . Hepatic biopsy      x 2  . Abdominal hysterectomy    . Cardiac electrophysiology mapping and ablation     Family History  Problem Relation Age of Onset  . Heart disease Mother   . Diabetes Mother   . Schizophrenia Mother   . Diabetes Father   . Thyroid disease Father   . Cancer Maternal Uncle   . Cancer Paternal Grandmother   . Cancer Cousin    History  Substance Use Topics  . Smoking status: Never Smoker   . Smokeless tobacco: Never Used  . Alcohol Use: Yes     Comment: occasional   OB History   Grav Para Term Preterm Abortions TAB SAB Ect Mult Living                  Review of Systems  Constitutional: Positive for fever (subjective) and chills.  HENT: Positive for congestion and sinus pressure. Negative for sore throat and trouble swallowing.   Eyes: Positive for visual disturbance (blurred vision).  Respiratory: Positive for cough. Negative for shortness of breath.   Cardiovascular: Negative for chest pain.  Gastrointestinal: Negative for nausea, vomiting, abdominal pain, diarrhea and blood in stool.  Musculoskeletal: Negative for neck pain and neck stiffness.  All other systems reviewed and are negative.    Allergies  Latex  Home Medications   Current Outpatient Rx  Name  Route  Sig  Dispense  Refill  . amLODipine (NORVASC) 2.5 MG tablet   Oral   Take 2.5 mg by mouth at bedtime.          . carvedilol (COREG) 6.25 MG tablet   Oral   Take 6.25 mg by mouth at bedtime.          . cyclobenzaprine (FLEXERIL) 5 MG tablet   Oral   Take 5 mg by mouth 3 (three) times daily as needed for muscle spasms.         Marland Kitchen. esomeprazole (NEXIUM) 40 MG capsule   Oral   Take 40 mg by mouth at bedtime.          Marland Kitchen. ezetimibe (ZETIA) 10 MG tablet   Oral   Take 10 mg by  mouth at bedtime.          Marland Kitchen lisinopril (PRINIVIL,ZESTRIL) 10 MG tablet   Oral   Take 10 mg by mouth at bedtime.          . Pitavastatin Calcium (LIVALO) 2 MG TABS   Oral   Take 2 mg by mouth at bedtime.          Marland Kitchen azithromycin (ZITHROMAX) 250 MG tablet   Oral   Take 1 tablet (250 mg total) by mouth daily. Take first 2 tablets together, then 1 every day until finished.   6 tablet   0    Triage Vitals: BP 137/93  Pulse 79  Temp(Src) 98.9 F (37.2 C) (Oral)  Resp 18  Ht 5\' 4"  (1.626 m)  Wt 144 lb (65.318 kg)  BMI 24.71 kg/m2  SpO2 98% Physical Exam  Nursing note and vitals reviewed. Constitutional: She is oriented to person, place, and time. She appears well-developed and well-nourished. No distress.  HENT:  Head: Normocephalic and atraumatic.  Mouth/Throat:  Oropharynx is clear and moist. No oropharyngeal exudate.  Negative swelling, erythema, inflammation, lesions, sores, exudate noted to the posterior oropharynx or tonsils. Negative tonsillar adenopathy identified. Negative trismus.  Eyes: Conjunctivae and EOM are normal. Pupils are equal, round, and reactive to light. Right eye exhibits no discharge. Left eye exhibits no discharge.  Neck: Normal range of motion. Neck supple. No tracheal deviation present.  Negative neck stiffness Negative nuchal rigidity Negative cervical lymphadenopathy  Cardiovascular: Normal rate, regular rhythm and normal heart sounds.  Exam reveals no friction rub.   No murmur heard. Pulses:      Radial pulses are 2+ on the right side, and 2+ on the left side.  Cap refill less than 3 seconds  Pulmonary/Chest: Effort normal and breath sounds normal. No respiratory distress. She has no wheezes. She has no rales.  Musculoskeletal: Normal range of motion.  Full ROM to upper and lower extremities without difficulty noted, negative ataxia noted.  Lymphadenopathy:    She has no cervical adenopathy.  Neurological: She is alert and oriented to person, place, and time. No cranial nerve deficit. She exhibits normal muscle tone. Coordination normal.  Cranial nerves III-XII grossly intact Strength 5+/5+ to upper extremities bilaterally with resistance applied, equal distribution noted  Skin: Skin is warm and dry. She is not diaphoretic.  Psychiatric: She has a normal mood and affect. Her behavior is normal.    ED Course  Procedures (including critical care time) DIAGNOSTIC STUDIES: Oxygen Saturation is 98% on RA, normal by my interpretation.   COORDINATION OF CARE: 4:29 PM- Informed pt of negative X-Ray. Will prescribe Tessalon Perles and Z-Pak. Pt verbalizes understanding and agrees to plan.  Medications  benzonatate (TESSALON) capsule 100 mg (not administered)  albuterol (PROVENTIL HFA;VENTOLIN HFA) 108 (90 BASE) MCG/ACT  inhaler 2 puff (not administered)    Dg Chest 2 View  01/24/2014   CLINICAL DATA Cough, congestion, chest pain  EXAM CHEST  2 VIEW  COMPARISON None.  FINDINGS The heart size and mediastinal contours are within normal limits. Both lungs are clear. The visualized skeletal structures are unremarkable.  IMPRESSION No active cardiopulmonary disease.  SIGNATURE  Electronically Signed   By: Esperanza Heir M.D.   On: 01/24/2014 15:36   Labs Review Labs Reviewed - No data to display Imaging Review Dg Chest 2 View  01/24/2014   CLINICAL DATA Cough, congestion, chest pain  EXAM CHEST  2 VIEW  COMPARISON None.  FINDINGS The heart  size and mediastinal contours are within normal limits. Both lungs are clear. The visualized skeletal structures are unremarkable.  IMPRESSION No active cardiopulmonary disease.  SIGNATURE  Electronically Signed   By: Esperanza Heir M.D.   On: 01/24/2014 15:36     EKG Interpretation None      MDM   Final diagnoses:  URI (upper respiratory infection)  Viral syndrome   Medications  benzonatate (TESSALON) capsule 100 mg (not administered)  albuterol (PROVENTIL HFA;VENTOLIN HFA) 108 (90 BASE) MCG/ACT inhaler 2 puff (not administered)    Filed Vitals:   01/24/14 1344  BP: 137/93  Pulse: 79  Temp: 98.9 F (37.2 C)  TempSrc: Oral  Resp: 18  Height: 5\' 4"  (1.626 m)  Weight: 144 lb (65.318 kg)  SpO2: 98%    I personally performed the services described in this documentation, which was scribed in my presence. The recorded information has been reviewed and is accurate.  Patient presenting to the ED with nasal congestion, dry cough, chest tightness associated with cough that has been ongoing for the past week. Stated that she's been feeling feverish. Patient was seen and assessed by her primary care provider who gave her cough syrup with minimal relief. Patient reported sick contacts at work.  Alert oriented. GCS 15. Heart rate and rhythm normal. Radial pulses 2+  bilaterally. Cap refill less than 3 seconds. Lungs clear to auscultation to upper and lower lobes bilaterally. Negative neck stiffness, negative rigidity, negative cervical lymphadenopathy noted. Negative trismus. Uvula midline with symmetrical elevation. Unremarkable oral exam identified. Chest x-ray negative for acute cardiopulmonary disease. Negative findings for pneumonia. Doubt pneumothorax. Suspicion to be viral syndrome. Patient stable, afebrile. Discharged patient. Discharged patient with Z-Pak and albuterol inhaler to aid in reduction of tightness. Discussed with patient to continue to take at home medications for cough. Discussed with patient to take medications on a full stomach. Discussed with patient to rest and stay hydrated. Referred patient to primary care provider. Discussed with patient to closely monitor symptoms and if symptoms are to worsen or change to report back to the ED - strict return instructions given.  Patient agreed to plan of care, understood, all questions answered.   Raymon Mutton, PA-C 01/24/14 1756

## 2014-01-24 NOTE — ED Notes (Signed)
Signature pad doesn't work. RT given pt. Their inhaler.

## 2014-01-26 NOTE — ED Provider Notes (Signed)
Medical screening examination/treatment/procedure(s) were performed by non-physician practitioner and as supervising physician I was immediately available for consultation/collaboration.   EKG Interpretation None       Flint MelterElliott L Therese Rocco, MD 01/26/14 1108

## 2014-03-14 ENCOUNTER — Encounter (HOSPITAL_COMMUNITY): Payer: Self-pay | Admitting: Emergency Medicine

## 2014-03-14 ENCOUNTER — Emergency Department (INDEPENDENT_AMBULATORY_CARE_PROVIDER_SITE_OTHER)
Admission: EM | Admit: 2014-03-14 | Discharge: 2014-03-14 | Disposition: A | Discharge status: Home / Self Care | Source: Home / Self Care | Attending: Family Medicine | Admitting: Family Medicine

## 2014-03-14 DIAGNOSIS — G43909 Migraine, unspecified, not intractable, without status migrainosus: Secondary | ICD-10-CM

## 2014-03-14 DIAGNOSIS — T7840XA Allergy, unspecified, initial encounter: Secondary | ICD-10-CM

## 2014-03-14 HISTORY — DX: Other seasonal allergic rhinitis: J30.2

## 2014-03-14 MED ORDER — ASPIRIN 325 MG PO TABS: 975.0000 mg | ORAL_TABLET | Freq: Once | ORAL | Status: DC

## 2014-03-14 MED ORDER — ASPIRIN 81 MG PO CHEW
324.0000 mg | CHEWABLE_TABLET | Freq: Once | ORAL | Status: AC
  Administered 2014-03-14: 324 mg via ORAL

## 2014-03-14 MED ORDER — DIPHENHYDRAMINE HCL 25 MG PO CAPS
ORAL_CAPSULE | ORAL | Status: AC
  Filled 2014-03-14: qty 1

## 2014-03-14 MED ORDER — DIPHENHYDRAMINE HCL 25 MG PO CAPS
25.0000 mg | ORAL_CAPSULE | Freq: Once | ORAL | Status: AC
  Administered 2014-03-14: 25 mg via ORAL

## 2014-03-14 MED ORDER — ASPIRIN 81 MG PO CHEW
CHEWABLE_TABLET | ORAL | Status: AC
  Filled 2014-03-14: qty 4

## 2014-03-14 NOTE — ED Provider Notes (Signed)
Medical screening examination/treatment/procedure(s) were performed by a resident physician or non-physician practitioner and as the supervising physician I was immediately available for consultation/collaboration.  Madelon Welsch, MD    Adreanna Fickel S Mert Dietrick, MD 03/14/14 2142 

## 2014-03-14 NOTE — ED Provider Notes (Signed)
CSN: [id number]     Arrival date & time 03/14/14  1920 History   First MD Initiated Contact with Patient 03/14/14 1930     Chief Complaint  Patient presents with  . Headache   (Consider location/radiation/quality/duration/timing/severity/associated sxs/prior Treatment) HPI  43 year old F with a headache. Frontal headache. Several hours duration. Pt thinks it is caused by sinus pressure from allergies but says it' similar to her migraines. She has photo and phonophobia. No nausea, vomiting or aura. No fever or chills.   This started today when the patient was in training outdoors for the [employer].   Past Medical History  Diagnosis Date  . Hypertension   . Fatty liver disease, nonalcoholic   . SVT (supraventricular tachycardia)   . Seasonal allergies     [place] [facility] [place]    Past Surgical History  Procedure Laterality Date  . Hepatic biopsy      x 2  . Abdominal hysterectomy    . Cardiac electrophysiology mapping and ablation     Family History  Problem Relation Age of Onset  . Heart disease Mother   . Diabetes Mother   . Schizophrenia Mother   . Diabetes Father   . Thyroid disease Father   . Cancer Maternal Uncle   . Cancer Paternal Grandmother   . Cancer Cousin    History  Substance Use Topics  . Smoking status: Never Smoker   . Smokeless tobacco: Never Used  . Alcohol Use: Yes     Comment: occasional   OB History   Grav Para Term Preterm Abortions TAB SAB Ect Mult Living                 Review of Systems Positive for runny nose, sinus congestion, ear pressure and headache and itching Allergies  Latex  Home Medications   Prior to Admission medications   Medication Sig Start Date End Date Taking? Authorizing Provider  amLODipine (NORVASC) 2.5 MG tablet Take 2.5 mg by mouth at bedtime.    Yes Historical Provider, MD  carvedilol (COREG) 6.25 MG tablet Take 6.25 mg by mouth at bedtime.    Yes Historical Provider, MD  esomeprazole  (NEXIUM) 40 MG capsule Take 40 mg by mouth at bedtime.    Yes Historical Provider, MD  ezetimibe (ZETIA) 10 MG tablet Take 10 mg by mouth at bedtime.    Yes Historical Provider, MD  lisinopril (PRINIVIL,ZESTRIL) 10 MG tablet Take 10 mg by mouth at bedtime.    Yes Historical Provider, MD  Pitavastatin Calcium (LIVALO) 2 MG TABS Take 2 mg by mouth at bedtime.    Yes Historical Provider, MD  azithromycin (ZITHROMAX) 250 MG tablet Take 1 tablet (250 mg total) by mouth daily. Take first 2 tablets together, then 1 every day until finished. 01/24/14   Marissa Sciacca, PA-C  cyclobenzaprine (FLEXERIL) 5 MG tablet Take 5 mg by mouth 3 (three) times daily as needed for muscle spasms.    Historical Provider, MD   BP 139/90  Pulse 78  Temp(Src) 98 F (36.7 C) (Oral)  Resp 24  SpO2 100% Physical Exam Gen: middle-aged [place], distress, sitting in a dark HEENT: normocephalic, atraumatic, pupils equal round and reactive to light, OP clear and moist, no ear effusion, frontal and maxillary sinus tenderness CV: RRR no murmurs PUlm: CTA-B Neuro: CN II-XII intact, alert and oriented x 3 Skin: diffuse urticaria  ED Course  Procedures (including critical care time) Labs Review Labs Reviewed -  No data to display  Imaging Review No results found.   MDM   1. Migraine headache   2. Allergic reaction    Migraine - treated with ASA 975 mg Allergic reaction to dust and pollen - benadryl 25 mg PO     Garnetta BuddyEdward V Shirley Decamp, MD 03/14/14 2021

## 2014-03-14 NOTE — ED Notes (Signed)
Called to assess pt.  C/o headache 10/10 and weakness.  Pt. moved to room 5.

## 2014-03-14 NOTE — Discharge Instructions (Signed)
Ms. Ashley George,   Nice to meet you. Sorry about your trouble. The aspirin should help with her headache. I think that the Benadryl helped with your itching and allergies. Please call with her regular doctor tomorrow if these things are not improved.  Sincerely,  Dr. Clinton SawyerWilliamson

## 2014-03-14 NOTE — ED Notes (Addendum)
C/o headache onset 12 N and progressively worse. C/o weakness but no dizziness. Feels like head is throbbing, feels like a ton a bricks. Was at work and did not have anything to take for pain.  Hx. Migraines, allergies and HTN .  No nausea.  C/o photophobia.  Voice sounds nasal. States she got allergies 1 yr ago after [place].  States her doctor gave her a shot last year for it and was told it would last 1 yr.

## 2014-04-25 ENCOUNTER — Encounter (HOSPITAL_COMMUNITY): Payer: Self-pay | Admitting: Emergency Medicine

## 2014-04-25 DIAGNOSIS — Z79899 Other long term (current) drug therapy: Secondary | ICD-10-CM | POA: Insufficient documentation

## 2014-04-25 DIAGNOSIS — M545 Low back pain, unspecified: Secondary | ICD-10-CM | POA: Insufficient documentation

## 2014-04-25 DIAGNOSIS — Z8719 Personal history of other diseases of the digestive system: Secondary | ICD-10-CM | POA: Insufficient documentation

## 2014-04-25 DIAGNOSIS — I471 Supraventricular tachycardia, unspecified: Secondary | ICD-10-CM | POA: Insufficient documentation

## 2014-04-25 DIAGNOSIS — Z91040 Latex allergy status: Secondary | ICD-10-CM | POA: Insufficient documentation

## 2014-04-25 DIAGNOSIS — I10 Essential (primary) hypertension: Secondary | ICD-10-CM | POA: Insufficient documentation

## 2014-04-25 DIAGNOSIS — R51 Headache: Secondary | ICD-10-CM | POA: Insufficient documentation

## 2014-04-25 NOTE — ED Notes (Signed)
Patient arrives with complaint of headache and lower back pain. Recently learned that she has herniated discs in her lower back. Suffers from migraines, but states that this headache feels more like blood pressure related pain.

## 2014-04-26 ENCOUNTER — Emergency Department (HOSPITAL_COMMUNITY)
Admission: EM | Admit: 2014-04-26 | Discharge: 2014-04-26 | Disposition: A | Discharge status: Home / Self Care | Attending: Emergency Medicine | Admitting: Emergency Medicine

## 2014-04-26 DIAGNOSIS — M545 Low back pain, unspecified: Secondary | ICD-10-CM

## 2014-04-26 MED ORDER — HYDROCODONE-IBUPROFEN 7.5-200 MG PO TABS: 1.0000 | ORAL_TABLET | Freq: Four times a day (QID) | ORAL | Status: DC | PRN

## 2014-04-26 MED ORDER — KETOROLAC TROMETHAMINE 60 MG/2ML IM SOLN
60.0000 mg | Freq: Once | INTRAMUSCULAR | Status: DC
  Filled 2014-04-26: qty 2

## 2014-04-26 MED ORDER — HYDROCODONE-ACETAMINOPHEN 5-325 MG PO TABS: 2.0000 | ORAL_TABLET | Freq: Once | ORAL | Status: DC

## 2014-04-26 MED ORDER — HYDROCODONE-ACETAMINOPHEN 5-325 MG PO TABS
2.0000 | ORAL_TABLET | ORAL | Status: DC | PRN
Start: 2014-04-26 — End: 2014-04-26

## 2014-04-26 NOTE — ED Provider Notes (Signed)
CSN: [id number]     Arrival date & time 04/25/14  2041 History   First MD Initiated Contact with Patient 04/26/14 0029     Chief Complaint  Patient presents with  . Headache  . Back Pain     (Consider location/radiation/quality/duration/timing/severity/associated sxs/prior Treatment) HPI Comments: Patient is a 43 year old female with history of lumbar disc disease. She states she had an MRI performed a couple weeks ago which revealed bulging discs. She has seen an orthopedist who has recommended steroid injections which she has been reluctant to do. She was prescribed Flexeril by her primary Dr. which does not seem to be helping. She presents today with complaints of ongoing pain. She denies any radiation to her legs, weakness, and denies any bowel or bladder complaints.  Patient is a 43 y.o. female presenting with back pain. The history is provided by the patient.  Back Pain Location:  Lumbar spine Quality:  Stabbing Radiates to:  Does not radiate Pain severity:  Moderate Duration:  2 weeks Timing:  Constant Progression:  Worsening Chronicity:  Chronic Relieved by:  Nothing Worsened by:  Bending, ambulation, movement and palpation   Past Medical History  Diagnosis Date  . Hypertension   . Fatty liver disease, nonalcoholic   . SVT (supraventricular tachycardia)   . Seasonal allergies     started 2014 after moving to [facility]    Past Surgical History  Procedure Laterality Date  . Hepatic biopsy      x 2  . Abdominal hysterectomy    . Cardiac electrophysiology mapping and ablation     Family History  Problem Relation Age of Onset  . Heart disease Mother   . Diabetes Mother   . Schizophrenia Mother   . Diabetes Father   . Thyroid disease Father   . Cancer Maternal Uncle   . Cancer Paternal Grandmother   . Cancer Cousin    History  Substance Use Topics  . Smoking status: Never Smoker   . Smokeless tobacco: Never Used  . Alcohol Use: Yes     Comment: occasional    OB History   Grav Para Term Preterm Abortions TAB SAB Ect Mult Living                 Review of Systems  Musculoskeletal: Positive for back pain.  All other systems reviewed and are negative.     Allergies  Latex  Home Medications   Prior to Admission medications   Medication Sig Start Date End Date Taking? Authorizing Provider  amLODipine (NORVASC) 2.5 MG tablet Take 2.5 mg by mouth at bedtime.     Historical Provider, MD  azithromycin (ZITHROMAX) 250 MG tablet Take 1 tablet (250 mg total) by mouth daily. Take first 2 tablets together, then 1 every day until finished. 01/24/14   Marissa Sciacca, PA-C  carvedilol (COREG) 6.25 MG tablet Take 6.25 mg by mouth at bedtime.     Historical Provider, MD  cyclobenzaprine (FLEXERIL) 5 MG tablet Take 5 mg by mouth 3 (three) times daily as needed for muscle spasms.    Historical Provider, MD  diphenhydrAMINE (BENADRYL) 25 mg capsule Take 25 mg by mouth at bedtime.    Historical Provider, MD  esomeprazole (NEXIUM) 40 MG capsule Take 40 mg by mouth at bedtime.     Historical Provider, MD  ezetimibe (ZETIA) 10 MG tablet Take 10 mg by mouth at bedtime.     Historical Provider, MD  lisinopril (PRINIVIL,ZESTRIL) 10 MG tablet Take 10 mg by mouth  at bedtime.     Historical Provider, MD  Pitavastatin Calcium (LIVALO) 2 MG TABS Take 2 mg by mouth at bedtime.     Historical Provider, MD   BP 150/98  Pulse 83  Temp(Src) 98.4 F (36.9 C) (Oral)  Resp 16  Ht 5\' 4"  (1.626 m)  Wt 145 lb (65.772 kg)  BMI 24.88 kg/m2  SpO2 100% Physical Exam  Nursing note and vitals reviewed. Constitutional: She is oriented to person, place, and time. She appears well-developed and well-nourished. No distress.  HENT:  Head: Normocephalic and atraumatic.  Neck: Normal range of motion. Neck supple.  Musculoskeletal: Normal range of motion.  There is tenderness to palpation in the paraspinal soft tissues of the lumbar region.  Neurological: She is alert and oriented  to person, place, and time.  Patellar and Achilles DTRs are 2+ and symmetrical. Strength is 5 out of 5 in the bilateral lower extremities. Patient is able to walk on her heels and toes without difficulty.  Skin: Skin is warm and dry. She is not diaphoretic.    ED Course  Procedures (including critical care time) Labs Review Labs Reviewed - No data to display  Imaging Review No results found.   EKG Interpretation None      MDM   Final diagnoses:  None    Patient presents with an exacerbation of chronic back pain. There are no red flags to suggest any neurosurgical emergency or indication for emergent imaging. She will be given Toradol and Vicodin and discharged with a prescription for Vicodin. She's been advised to followup with her primary Dr. and orthopedist to decide whether or not she wants to go through with the steroid injections.    Geoffery Lyonsouglas Julizza Sassone, MD 04/26/14 [phone]

## 2014-04-26 NOTE — Discharge Instructions (Signed)
Ibuprofen 600 mg 3 times daily for the next 5 days. Hydrocodone as needed for pain not relieved with ibuprofen.  Followup with your primary doctor and orthopedic surgeon.   Back Pain, Adult Low back pain is very common. About 1 in 5 people have back pain.The cause of low back pain is rarely dangerous. The pain often gets better over time.About half of people with a sudden onset of back pain feel better in just 2 weeks. About 8 in 10 people feel better by 6 weeks.  CAUSES Some common causes of back pain include:  Strain of the muscles or ligaments supporting the spine.  Wear and tear (degeneration) of the spinal discs.  Arthritis.  Direct injury to the back. DIAGNOSIS Most of the time, the direct cause of low back pain is not known.However, back pain can be treated effectively even when the exact cause of the pain is unknown.Answering your caregiver's questions about your overall health and symptoms is one of the most accurate ways to make sure the cause of your pain is not dangerous. If your caregiver needs more information, he or she may order lab work or imaging tests (X-rays or MRIs).However, even if imaging tests show changes in your back, this usually does not require surgery. HOME CARE INSTRUCTIONS For many people, back pain returns.Since low back pain is rarely dangerous, it is often a condition that people can learn to [facility] their own.   Remain active. It is stressful on the back to sit or stand in one place. Do not sit, drive, or stand in one place for more than 30 minutes at a time. Take short walks on level surfaces as soon as pain allows.Try to increase the length of time you walk each day.  Do not stay in bed.Resting more than 1 or 2 days can delay your recovery.  Do not avoid exercise or work.Your body is made to move.It is not dangerous to be active, even though your back may hurt.Your back will likely heal faster if you return to being active before your pain  is gone.  Pay attention to your body when you bend and lift. Many people have less discomfortwhen lifting if they bend their knees, keep the load close to their bodies,and avoid twisting. Often, the most comfortable positions are those that put less stress on your recovering back.  Find a comfortable position to sleep. Use a firm mattress and lie on your side with your knees slightly bent. If you lie on your back, put a pillow under your knees.  Only take over-the-counter or prescription medicines as directed by your caregiver. Over-the-counter medicines to reduce pain and inflammation are often the most helpful.Your caregiver may prescribe muscle relaxant drugs.These medicines help dull your pain so you can more quickly return to your normal activities and healthy exercise.  Put ice on the injured area.  Put ice in a plastic bag.  Place a towel between your skin and the bag.  Leave the ice on for 15-20 minutes, 03-04 times a day for the first 2 to 3 days. After that, ice and heat may be alternated to reduce pain and spasms.  Ask your caregiver about trying back exercises and gentle massage. This may be of some benefit.  Avoid feeling anxious or stressed.Stress increases muscle tension and can worsen back pain.It is important to recognize when you are anxious or stressed and learn ways to manage it.Exercise is a great option. SEEK MEDICAL CARE IF:  You have pain that  is not relieved with rest or medicine.  You have pain that does not improve in 1 week.  You have new symptoms.  You are generally not feeling well. SEEK IMMEDIATE MEDICAL CARE IF:   You have pain that radiates from your back into your legs.  You develop new bowel or bladder control problems.  You have unusual weakness or numbness in your arms or legs.  You develop nausea or vomiting.  You develop abdominal pain.  You feel faint. Document Released: 11/02/2005 Document Revised: 05/03/2012 Document Reviewed:  03/23/2011 [facility] Patient Information 2014 [place], [place].

## 2014-04-26 NOTE — ED Notes (Signed)
Discharge instructions reviewed with pt. Pt verbalized understanding.   

## 2014-04-26 NOTE — ED Notes (Signed)
Pt in bed and placed in gown. Pt monitored by 5-lead, bp cuff, and pulse ox.

## 2014-05-02 ENCOUNTER — Emergency Department (HOSPITAL_COMMUNITY)
Admission: EM | Admit: 2014-05-02 | Discharge: 2014-05-02 | Disposition: A | Discharge status: Home / Self Care | Attending: Emergency Medicine | Admitting: Emergency Medicine

## 2014-05-02 ENCOUNTER — Encounter (HOSPITAL_COMMUNITY): Payer: Self-pay | Admitting: Emergency Medicine

## 2014-05-02 DIAGNOSIS — I498 Other specified cardiac arrhythmias: Secondary | ICD-10-CM | POA: Insufficient documentation

## 2014-05-02 DIAGNOSIS — Z91040 Latex allergy status: Secondary | ICD-10-CM | POA: Insufficient documentation

## 2014-05-02 DIAGNOSIS — I10 Essential (primary) hypertension: Secondary | ICD-10-CM | POA: Insufficient documentation

## 2014-05-02 DIAGNOSIS — R209 Unspecified disturbances of skin sensation: Secondary | ICD-10-CM | POA: Insufficient documentation

## 2014-05-02 DIAGNOSIS — R200 Anesthesia of skin: Secondary | ICD-10-CM

## 2014-05-02 DIAGNOSIS — J309 Allergic rhinitis, unspecified: Secondary | ICD-10-CM | POA: Insufficient documentation

## 2014-05-02 DIAGNOSIS — Z79899 Other long term (current) drug therapy: Secondary | ICD-10-CM | POA: Insufficient documentation

## 2014-05-02 DIAGNOSIS — K7689 Other specified diseases of liver: Secondary | ICD-10-CM | POA: Insufficient documentation

## 2014-05-02 MED ORDER — METHYLPREDNISOLONE 4 MG PO KIT: PACK | ORAL | Status: AC

## 2014-05-02 MED ORDER — NAPROXEN 500 MG PO TABS: 500.0000 mg | ORAL_TABLET | Freq: Two times a day (BID) | ORAL | Status: AC

## 2014-05-02 NOTE — ED Notes (Addendum)
Pt reports right numbness to right leg x 1 week and tingling to right foot. Hx of herniated disc, had MRI done 3 weeks ago, denies having numbness to leg prior to Thursday. Ambulatory at triage.

## 2014-05-02 NOTE — Discharge Instructions (Signed)
Paresthesia Paresthesia is an abnormal burning or prickling sensation. This sensation is generally felt in the hands, arms, legs, or feet. However, it may occur in any part of the body. It is usually not painful. The feeling may be described as:  Tingling or numbness.  "Pins and needles."  Skin crawling.  Buzzing.  Limbs "falling asleep."  Itching. Most people experience temporary (transient) paresthesia at some time in their lives. CAUSES  Paresthesia may occur when you breathe too quickly (hyperventilation). It can also occur without any apparent cause. Commonly, paresthesia occurs when pressure is placed on a nerve. The feeling quickly goes away once the pressure is removed. For some people, however, paresthesia is a long-lasting (chronic) condition caused by an underlying disorder. The underlying disorder may be:  A traumatic, direct injury to nerves. Examples include a:  Broken (fractured) neck.  Fractured skull.  A disorder affecting the brain and spinal cord (central nervous system). Examples include:  Transverse myelitis.  Encephalitis.  Transient ischemic attack.  Multiple sclerosis.  Stroke.  Tumor or blood vessel problems, such as an arteriovenous malformation pressing against the brain or spinal cord.  A condition that damages the peripheral nerves (peripheral neuropathy). Peripheral nerves are not part of the brain and spinal cord. These conditions include:  Diabetes.  Peripheral vascular disease.  Nerve entrapment syndromes, such as carpal tunnel syndrome.  Shingles.  Hypothyroidism.  Vitamin B12 deficiencies.  Alcoholism.  Heavy metal poisoning (lead, arsenic).  Rheumatoid arthritis.  Systemic lupus erythematosus. DIAGNOSIS  Your caregiver will attempt to find the underlying cause of your paresthesia. Your caregiver may:  Take your medical history.  Perform a physical exam.  Order various lab tests.  Order imaging tests. TREATMENT    Treatment for paresthesia depends on the underlying cause. HOME CARE INSTRUCTIONS  Avoid drinking alcohol.  You may consider massage or acupuncture to help relieve your symptoms.  Keep all follow-up appointments as directed by your caregiver. SEEK IMMEDIATE MEDICAL CARE IF:   You feel weak.  You have trouble walking or moving.  You have problems with speech or vision.  You feel confused.  You cannot control your bladder or bowel movements.  You feel numbness after an injury.  You faint.  Your burning or prickling feeling gets worse when walking.  You have pain, cramps, or dizziness.  You develop a rash. MAKE SURE YOU:  Understand these instructions.  Will watch your condition.  Will get help right away if you are not doing well or get worse. Document Released: 10/23/2002 Document Revised: 01/25/2012 Document Reviewed: 07/24/2011 [facility] Patient Information 2015 [place]. This information is not intended to replace advice given to you by your health care provider. Make sure you discuss any questions you have with your health care provider.  Paresthesia Paresthesia is an abnormal burning or prickling sensation. This sensation is generally felt in the hands, arms, legs, or feet. However, it may occur in any part of the body. It is usually not painful. The feeling may be described as:  Tingling or numbness.  "Pins and needles."  Skin crawling.  Buzzing.  Limbs "falling asleep."  Itching. Most people experience temporary (transient) paresthesia at some time in their lives. CAUSES  Paresthesia may occur when you breathe too quickly (hyperventilation). It can also occur without any apparent cause. Commonly, paresthesia occurs when pressure is placed on a nerve. The feeling quickly goes away once the pressure is removed. For some people, however, paresthesia is a long-lasting (chronic) condition caused by  an underlying disorder. The underlying disorder may  be:  A traumatic, direct injury to nerves. Examples include a:  Broken (fractured) neck.  Fractured skull.  A disorder affecting the brain and spinal cord (central nervous system). Examples include:  Transverse myelitis.  Encephalitis.  Transient ischemic attack.  Multiple sclerosis.  Stroke.  Tumor or blood vessel problems, such as an arteriovenous malformation pressing against the brain or spinal cord.  A condition that damages the peripheral nerves (peripheral neuropathy). Peripheral nerves are not part of the brain and spinal cord. These conditions include:  Diabetes.  Peripheral vascular disease.  Nerve entrapment syndromes, such as carpal tunnel syndrome.  Shingles.  Hypothyroidism.  Vitamin B12 deficiencies.  Alcoholism.  Heavy metal poisoning (lead, arsenic).  Rheumatoid arthritis.  Systemic lupus erythematosus. DIAGNOSIS  Your caregiver will attempt to find the underlying cause of your paresthesia. Your caregiver may:  Take your medical history.  Perform a physical exam.  Order various lab tests.  Order imaging tests. TREATMENT  Treatment for paresthesia depends on the underlying cause. HOME CARE INSTRUCTIONS  Avoid drinking alcohol.  You may consider massage or acupuncture to help relieve your symptoms.  Keep all follow-up appointments as directed by your caregiver. SEEK IMMEDIATE MEDICAL CARE IF:   You feel weak.  You have trouble walking or moving.  You have problems with speech or vision.  You feel confused.  You cannot control your bladder or bowel movements.  You feel numbness after an injury.  You faint.  Your burning or prickling feeling gets worse when walking.  You have pain, cramps, or dizziness.  You develop a rash. MAKE SURE YOU:  Understand these instructions.  Will watch your condition.  Will get help right away if you are not doing well or get worse. Document Released: 10/23/2002 Document Revised:  01/25/2012 Document Reviewed: 07/24/2011 [facility] Patient Information 2015 [place], [place]. This information is not intended to replace advice given to you by your health care provider. Make sure you discuss any questions you have with your health care provider.

## 2014-05-02 NOTE — ED Provider Notes (Signed)
CSN: [id number]     Arrival date & time 05/02/14  1615 History   First MD Initiated Contact with Patient 05/02/14 2026     Chief Complaint  Patient presents with  . Numbness     HPI  Patient presents with complaint of numbness in her thigh. She's had numbness in her thigh for "a while". Has pain in her back. Had MRI  via her primary care physician. Has a result with her. Denies numbness. Denies incontinence. Denies pain. Denies weakness. Getting physical therapy for her back.  Past Medical History  Diagnosis Date  . Hypertension   . Fatty liver disease, nonalcoholic   . SVT (supraventricular tachycardia)   . Seasonal allergies     [place]    Past Surgical History  Procedure Laterality Date  . Hepatic biopsy      x 2  . Abdominal hysterectomy    . Cardiac electrophysiology mapping and ablation     Family History  Problem Relation Age of Onset  . Heart disease Mother   . Diabetes Mother   . Schizophrenia Mother   . Diabetes Father   . Thyroid disease Father   . Cancer Maternal Uncle   . Cancer Paternal Grandmother   . Cancer Cousin    History  Substance Use Topics  . Smoking status: Never Smoker   . Smokeless tobacco: Never Used  . Alcohol Use: Yes     Comment: occasional   OB History   Grav Para Term Preterm Abortions TAB SAB Ect Mult Living                 Review of Systems  Constitutional: Negative for fever, chills, diaphoresis, appetite change and fatigue.  HENT: Negative for mouth sores, sore throat and trouble swallowing.   Eyes: Negative for visual disturbance.  Respiratory: Negative for cough, chest tightness, shortness of breath and wheezing.   Cardiovascular: Negative for chest pain.  Gastrointestinal: Negative for nausea, vomiting, abdominal pain, diarrhea and abdominal distention.  Endocrine: Negative for polydipsia, polyphagia and polyuria.  Genitourinary: Negative for dysuria, frequency and hematuria.    Musculoskeletal: Negative for gait problem.  Skin: Negative for color change, pallor and rash.  Neurological: Positive for numbness. Negative for dizziness, syncope, light-headedness and headaches.  Hematological: Does not bruise/bleed easily.  Psychiatric/Behavioral: Negative for behavioral problems and confusion.      Allergies  Latex  Home Medications   Prior to Admission medications   Medication Sig Start Date End Date Taking? Authorizing Provider  amLODipine (NORVASC) 2.5 MG tablet Take 2.5 mg by mouth at bedtime.    Yes Historical Provider, MD  carvedilol (COREG) 12.5 MG tablet Take 12.5 mg by mouth 2 (two) times daily with a meal.   Yes Historical Provider, MD  cyclobenzaprine (FLEXERIL) 5 MG tablet Take 10 mg by mouth 3 (three) times daily as needed for muscle spasms.    Yes Historical Provider, MD  diphenhydrAMINE (BENADRYL) 25 mg capsule Take 25 mg by mouth at bedtime.   Yes Historical Provider, MD  esomeprazole (NEXIUM) 40 MG capsule Take 40 mg by mouth at bedtime.    Yes Historical Provider, MD  ezetimibe (ZETIA) 10 MG tablet Take 10 mg by mouth at bedtime.    Yes Historical Provider, MD  Pitavastatin Calcium (LIVALO) 2 MG TABS Take 2 mg by mouth at bedtime.    Yes Historical Provider, MD  methylPREDNISolone (MEDROL DOSEPAK) 4 MG tablet 6 po on day 1, decrease by 1  tab per day 05/02/14   Rolland PorterMark James, MD  naproxen (NAPROSYN) 500 MG tablet Take 1 tablet (500 mg total) by mouth 2 (two) times daily. 05/02/14   Rolland PorterMark James, MD   BP 156/96  Pulse 94  Temp(Src) 99.1 F (37.3 C) (Oral)  Resp 18  Wt 149 lb 6.4 oz (67.767 kg)  SpO2 100% Physical Exam  Constitutional: She is oriented to person, place, and time. She appears well-developed and well-nourished. No distress.  HENT:  Head: Normocephalic.  Eyes: Conjunctivae are normal. Pupils are equal, round, and reactive to light. No scleral icterus.  Neck: Normal range of motion. Neck supple. No thyromegaly present.   Cardiovascular: Normal rate and regular rhythm.  Exam reveals no gallop and no friction rub.   No murmur heard. Pulmonary/Chest: Effort normal and breath sounds normal. No respiratory distress. She has no wheezes. She has no rales.  Abdominal: Soft. Bowel sounds are normal. She exhibits no distension. There is no tenderness. There is no rebound.  Musculoskeletal: Normal range of motion.       Legs: Neurological: She is alert and oriented to person, place, and time.  Normal symmetric Strength to shoulder shrug, triceps, biceps, grip,wrist flex/extend,and intrinsics  Norma lsymmetric sensation above and below clavicles, and to all distributions to UEs. Norma symmetric strength to flex/.extend hip and knees, dorsi/plantar flex ankles. Describes decreased sensation to the anterior thigh, lateral hip, into the lower paraspinal right lower back. Patellar and achilles reflexes 1-2+. Downgoing Babinski   Skin: Skin is warm and dry. No rash noted.  Psychiatric: She has a normal mood and affect. Her behavior is normal.    ED Course  Procedures (including critical care time) Labs Review Labs Reviewed - No data to display  Imaging Review No results found.   EKG Interpretation None      MDM   Final diagnoses:  Numbness    Patient has a normal exam. No weakness. No foot drop. Denies incontinence. Does show symptoms of her paresthesias and numbness in the L5 distribution. MRI does not show significant disease. No midline protrusion, or nerve root entrapment or encroachment. Plan will be primary care followup. Continue physical therapy. Medrol Dosepak. Naproxen. Given review of return precautions including incontinence weakness foot drop etc.    Rolland PorterMark James, MD 05/02/14 2115

## 2014-05-02 NOTE — ED Notes (Signed)
[place]

## 2014-10-25 ENCOUNTER — Encounter (HOSPITAL_COMMUNITY): Payer: Self-pay | Admitting: Cardiology

## 2014-11-12 ENCOUNTER — Encounter: Payer: Self-pay | Admitting: *Deleted

## 2015-12-30 IMAGING — CR DG CHEST 2V
2 series · 2 of 2 positions shown · non-contrast
Comparison: none

[w chest pa]
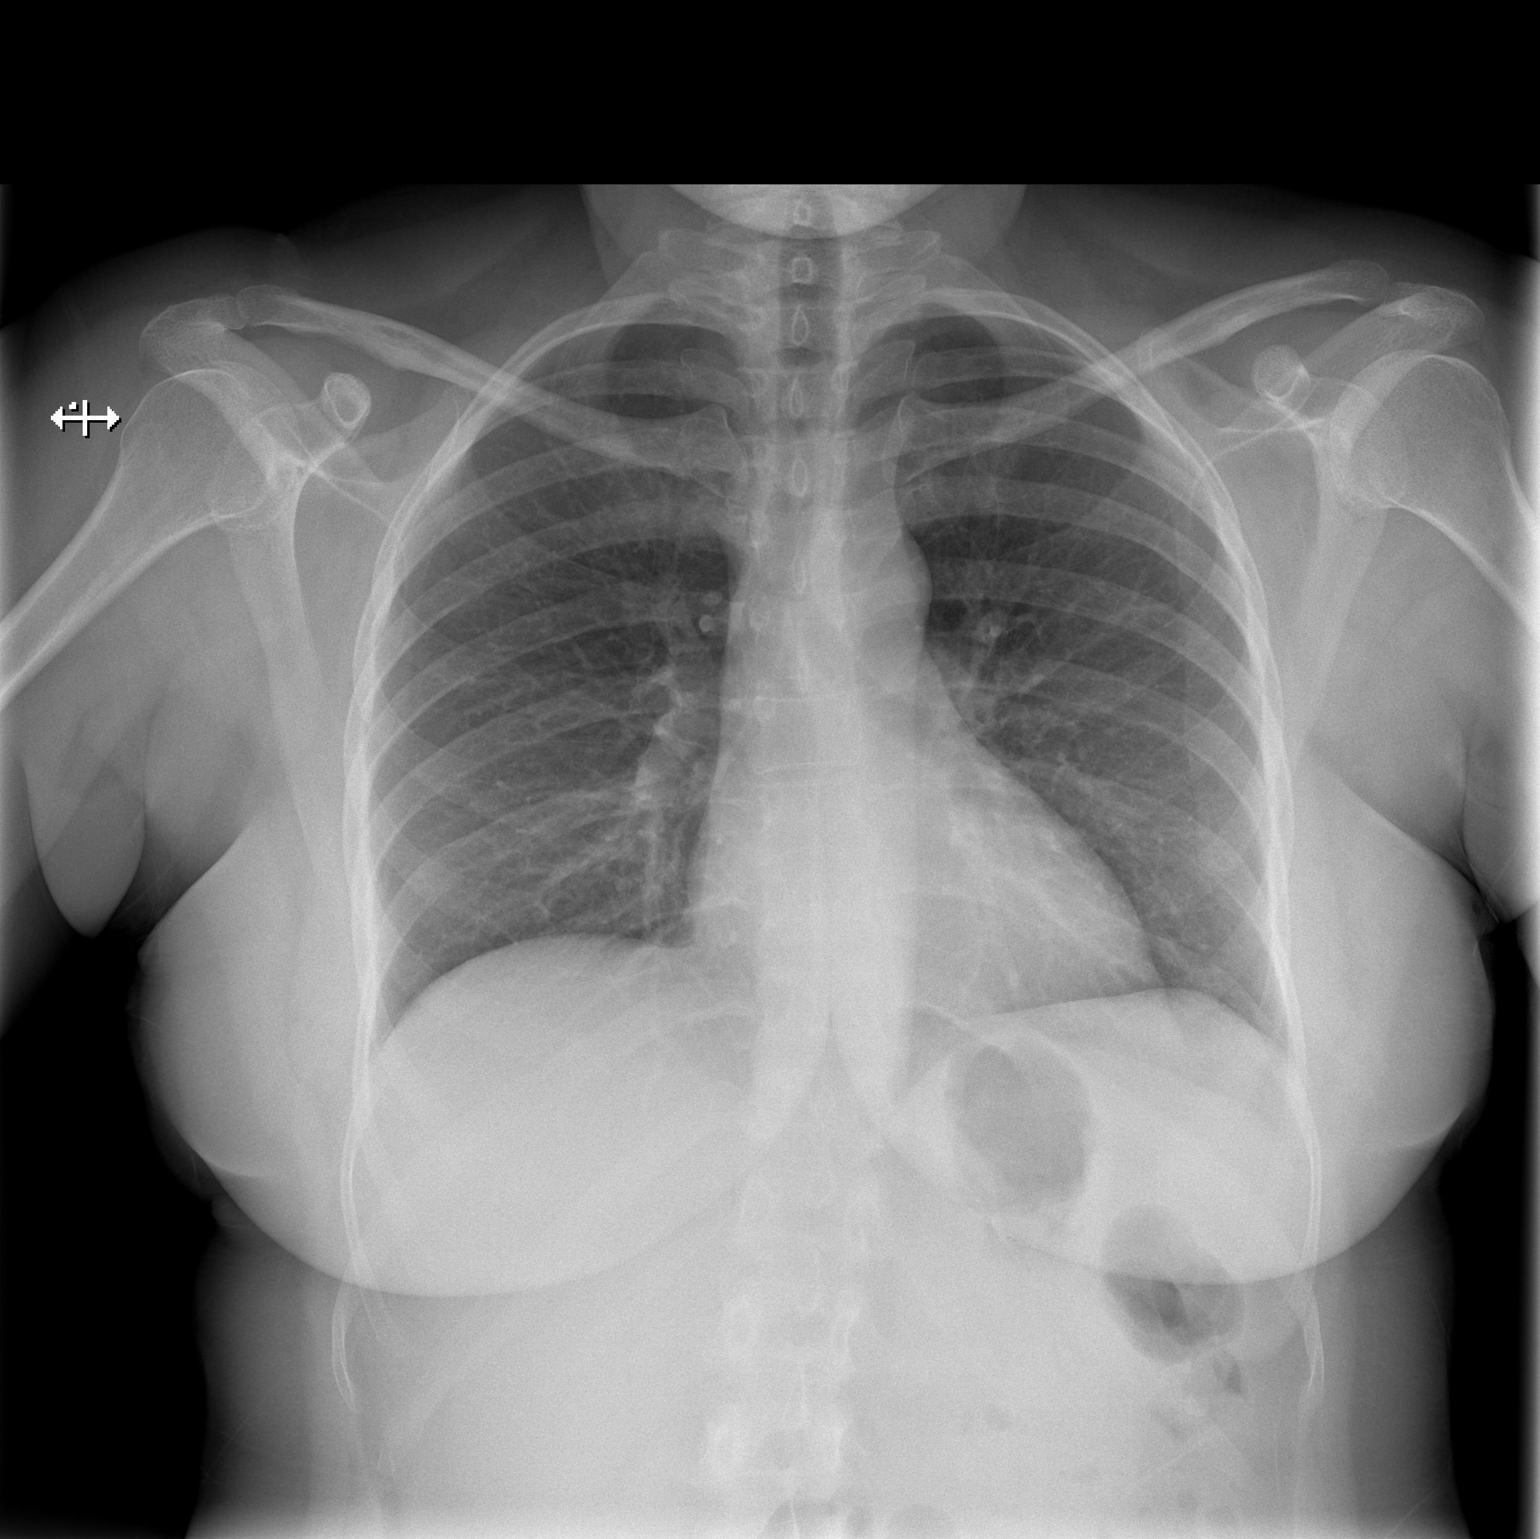

[w chest lat]
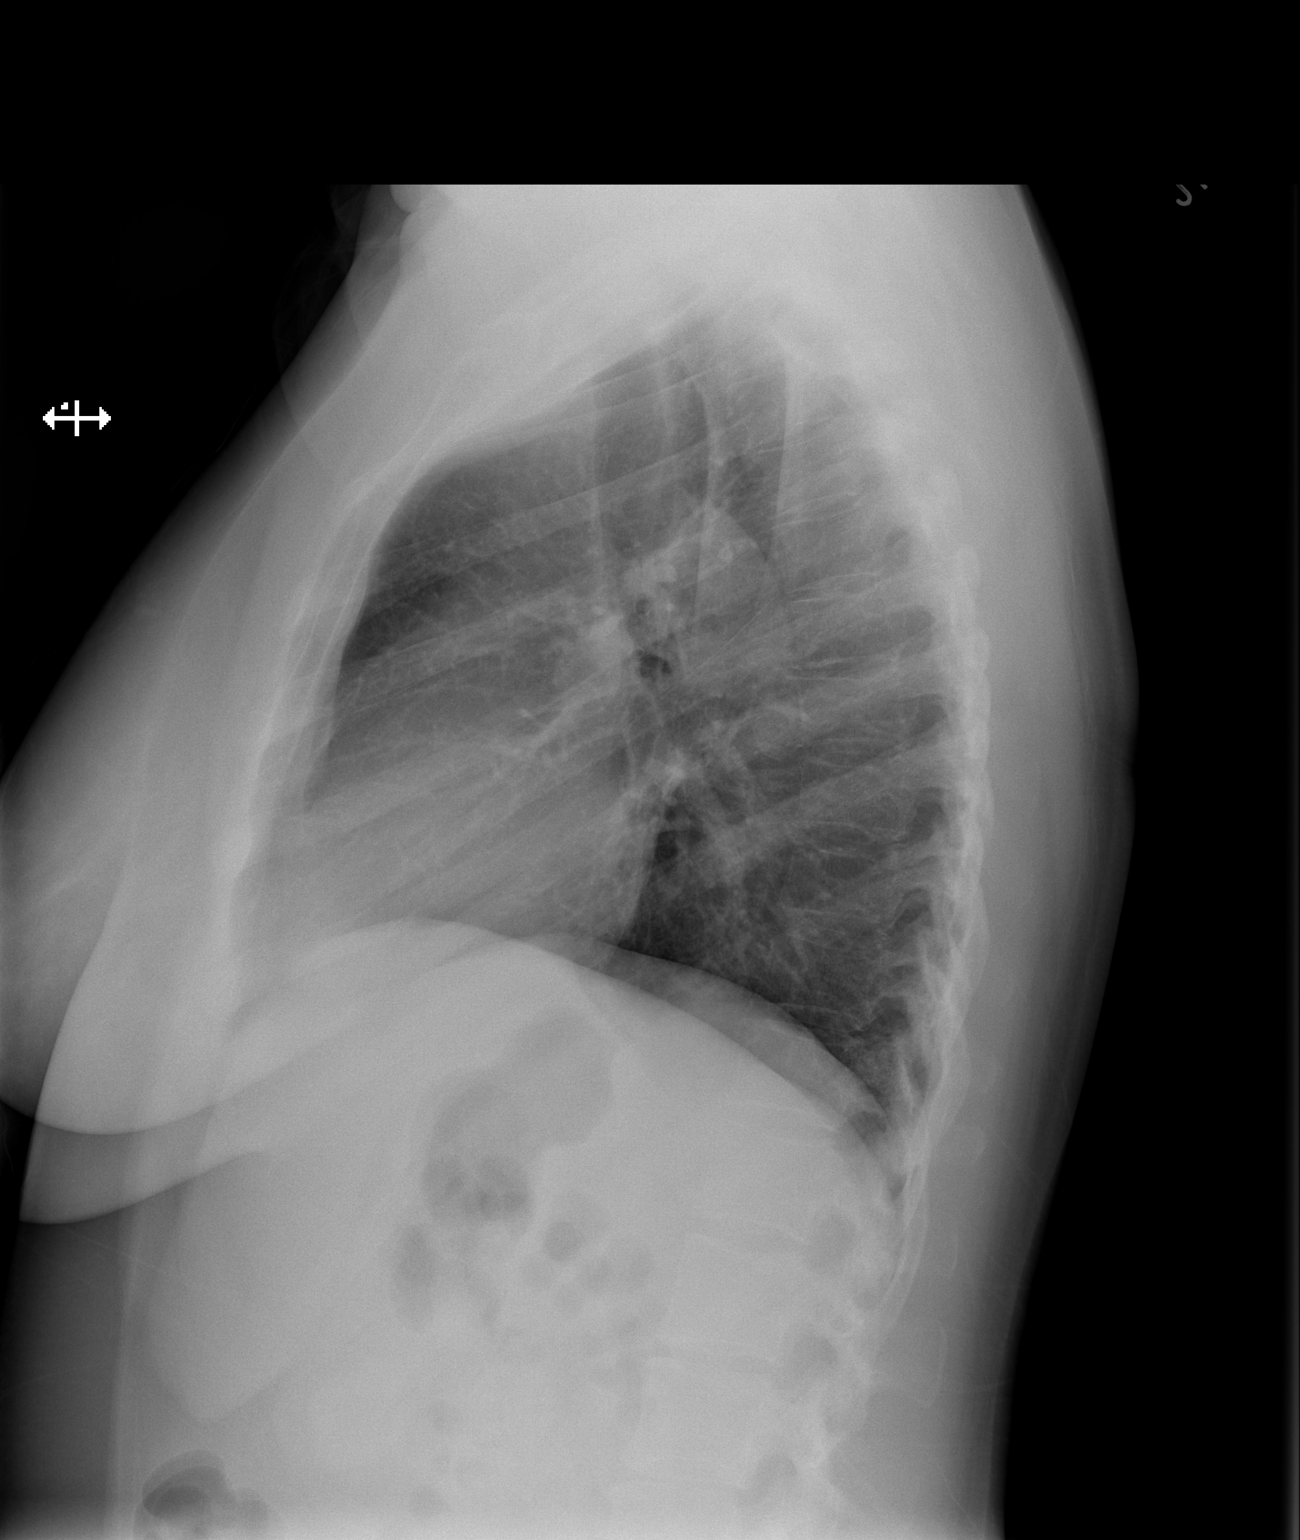

[2 of 2 positions shown; findings below may reference images not displayed]

CLINICAL DATA
Cough, congestion, chest pain

EXAM
CHEST  2 VIEW

COMPARISON
None.

FINDINGS
The heart size and mediastinal contours are within normal limits.
Both lungs are clear. The visualized skeletal structures are
unremarkable.

IMPRESSION
No active cardiopulmonary disease.

SIGNATURE

## 2018-12-20 ENCOUNTER — Other Ambulatory Visit: Payer: Self-pay
